# Patient Record
Sex: Female | Born: 2000 | Hispanic: Yes | Marital: Single | Smoking: Former smoker
Health system: Southern US, Community
[De-identification: ages and names within clinical notes are randomized; demographics above are authoritative.]

## PROBLEM LIST (undated history)

## (undated) ENCOUNTER — Inpatient Hospital Stay (HOSPITAL_COMMUNITY): Payer: Self-pay

## (undated) DIAGNOSIS — O139 Gestational [pregnancy-induced] hypertension without significant proteinuria, unspecified trimester: Secondary | ICD-10-CM

## (undated) DIAGNOSIS — O26613 Liver and biliary tract disorders in pregnancy, third trimester: Secondary | ICD-10-CM

## (undated) DIAGNOSIS — K831 Obstruction of bile duct: Secondary | ICD-10-CM

## (undated) DIAGNOSIS — O26643 Intrahepatic cholestasis of pregnancy, third trimester: Secondary | ICD-10-CM

## (undated) HISTORY — DX: Intrahepatic cholestasis of pregnancy, third trimester: O26.643

## (undated) HISTORY — DX: Obstruction of bile duct: K83.1

## (undated) HISTORY — DX: Liver and biliary tract disorders in pregnancy, third trimester: O26.613

---
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
## 2018-01-09 ENCOUNTER — Encounter (HOSPITAL_COMMUNITY): Payer: Self-pay | Admitting: *Deleted

## 2018-01-09 ENCOUNTER — Inpatient Hospital Stay (HOSPITAL_COMMUNITY): Payer: Self-pay

## 2018-01-09 ENCOUNTER — Inpatient Hospital Stay (HOSPITAL_COMMUNITY)
Admission: AD | Admit: 2018-01-09 | Discharge: 2018-01-09 | Disposition: A | Discharge status: Home / Self Care | Payer: Self-pay | Source: Ambulatory Visit | Attending: Obstetrics and Gynecology | Admitting: Obstetrics and Gynecology

## 2018-01-09 ENCOUNTER — Ambulatory Visit (INDEPENDENT_AMBULATORY_CARE_PROVIDER_SITE_OTHER): Payer: Self-pay

## 2018-01-09 DIAGNOSIS — Z789 Other specified health status: Secondary | ICD-10-CM

## 2018-01-09 DIAGNOSIS — O23591 Infection of other part of genital tract in pregnancy, first trimester: Secondary | ICD-10-CM | POA: Insufficient documentation

## 2018-01-09 DIAGNOSIS — O23599 Infection of other part of genital tract in pregnancy, unspecified trimester: Secondary | ICD-10-CM | POA: Diagnosis present

## 2018-01-09 DIAGNOSIS — Z3A01 Less than 8 weeks gestation of pregnancy: Secondary | ICD-10-CM | POA: Insufficient documentation

## 2018-01-09 DIAGNOSIS — O209 Hemorrhage in early pregnancy, unspecified: Secondary | ICD-10-CM | POA: Insufficient documentation

## 2018-01-09 DIAGNOSIS — Z3480 Encounter for supervision of other normal pregnancy, unspecified trimester: Secondary | ICD-10-CM

## 2018-01-09 DIAGNOSIS — B9689 Other specified bacterial agents as the cause of diseases classified elsewhere: Secondary | ICD-10-CM | POA: Diagnosis present

## 2018-01-09 DIAGNOSIS — Z3201 Encounter for pregnancy test, result positive: Secondary | ICD-10-CM

## 2018-01-09 DIAGNOSIS — O09899 Supervision of other high risk pregnancies, unspecified trimester: Secondary | ICD-10-CM

## 2018-01-09 LAB — WET PREP, GENITAL
Sperm: NONE SEEN
Trich, Wet Prep: NONE SEEN
Yeast Wet Prep HPF POC: NONE SEEN

## 2018-01-09 LAB — CBC WITH DIFFERENTIAL/PLATELET
BASOS ABS: 0 10*3/uL (ref 0.0–0.1)
Basophils Relative: 0 %
EOS ABS: 0.1 10*3/uL (ref 0.0–0.5)
EOS PCT: 1 %
HCT: 42.9 % (ref 36.0–46.0)
Hemoglobin: 13.9 g/dL (ref 12.0–15.0)
LYMPHS ABS: 2.7 10*3/uL (ref 0.7–4.0)
LYMPHS PCT: 22 %
MCH: 27.8 pg (ref 26.0–34.0)
MCHC: 32.4 g/dL (ref 30.0–36.0)
MCV: 85.8 fL (ref 80.0–100.0)
MONO ABS: 0.6 10*3/uL (ref 0.1–1.0)
MONOS PCT: 5 %
Neutro Abs: 9 10*3/uL — ABNORMAL HIGH (ref 1.7–7.7)
Neutrophils Relative %: 72 %
PLATELETS: 269 10*3/uL (ref 150–400)
RBC: 5 MIL/uL (ref 3.87–5.11)
RDW: 15.5 % (ref 11.5–15.5)
WBC: 12.5 10*3/uL — ABNORMAL HIGH (ref 4.0–10.5)
nRBC: 0 % (ref 0.0–0.2)

## 2018-01-09 LAB — URINALYSIS, ROUTINE W REFLEX MICROSCOPIC
BILIRUBIN URINE: NEGATIVE
Glucose, UA: NEGATIVE mg/dL
Hgb urine dipstick: NEGATIVE
Ketones, ur: NEGATIVE mg/dL
LEUKOCYTES UA: NEGATIVE
NITRITE: NEGATIVE
PH: 5 (ref 5.0–8.0)
Protein, ur: NEGATIVE mg/dL
SPECIFIC GRAVITY, URINE: 1.023 (ref 1.005–1.030)

## 2018-01-09 LAB — POCT PREGNANCY, URINE: Preg Test, Ur: POSITIVE — AB

## 2018-01-09 LAB — HCG, QUANTITATIVE, PREGNANCY: HCG, BETA CHAIN, QUANT, S: 9684 m[IU]/mL — AB (ref <5)

## 2018-01-09 MED ORDER — PROMETHAZINE HCL 25 MG PO TABS: 12.5000 mg | ORAL_TABLET | Freq: Four times a day (QID) | ORAL | 0 refills | Status: DC | PRN

## 2018-01-09 MED ORDER — METRONIDAZOLE 500 MG PO TABS: 500.0000 mg | ORAL_TABLET | Freq: Two times a day (BID) | ORAL | 0 refills | Status: DC

## 2018-01-09 NOTE — MAU Provider Note (Signed)
History     CSN: [id number]  Arrival date and time: 01/09/18 4098   First Provider Initiated Contact with Patient 01/09/18 0940      Chief Complaint  Patient presents with  . Vaginal Bleeding   Kellie Cook is a 17 y.o. G2P1001 at [redacted]w[redacted]d who is here today with vaginal bleeding.   Vaginal Bleeding  The patient's primary symptoms include pelvic pain and vaginal bleeding. The patient's pertinent negatives include no vaginal discharge. This is a new problem. The current episode started yesterday. The problem occurs constantly. The problem has been unchanged. The pain is mild. The problem affects both sides. She is pregnant. Pertinent negatives include no chills, dysuria, fever, frequency, nausea or vomiting. The vaginal discharge was bloody. The vaginal bleeding is spotting. She has not been passing clots. She has not been passing tissue. Nothing aggravates the symptoms. She has tried nothing for the symptoms. Her menstrual history has been regular (LMP 12/16/17 ).    OB History    Gravida  2   Para  1   Term  1   Preterm      AB      Living  1     SAB      TAB      Ectopic      Multiple  0   Live Births  1           Past Medical History:  Diagnosis Date  . Cholestasis during pregnancy in third trimester     Past Surgical History:  Procedure Laterality Date  . SUBDERMAL ETONOGESTREL IMPLANT INSERTION  03/24/2016        Family History  Problem Relation Age of Onset  . Heart disease Mother   . Heart disease Father     Social History   Tobacco Use  . Smoking status: Never Smoker  . Smokeless tobacco: Never Used  Substance Use Topics  . Alcohol use: No  . Drug use: No    Allergies: No Known Allergies  Medications Prior to Admission  Medication Sig Dispense Refill Last Dose  . ibuprofen (ADVIL,MOTRIN) 600 MG tablet Take 1 tablet (600 mg total) by mouth every 6 (six) hours. (Patient not taking: Reported on 05/12/2016) 30 tablet 0 Not Taking   . norgestimate-ethinyl estradiol (ORTHO-CYCLEN,SPRINTEC,PREVIFEM) 0.25-35 MG-MCG tablet Take 1 tablet by mouth daily. (Patient not taking: Reported on 01/09/2018) 1 Package 0 Not Taking  . Prenatal Vit-Fe Fumarate-FA (PRENATAL MULTIVITAMIN) TABS tablet Take 1 tablet by mouth daily.    Not Taking    Review of Systems  Constitutional: Negative for chills and fever.  Gastrointestinal: Negative for nausea and vomiting.  Genitourinary: Positive for pelvic pain and vaginal bleeding. Negative for dysuria, frequency and vaginal discharge.   Physical Exam   Blood pressure 126/60, pulse 88, temperature 97.9 F (36.6 C), temperature source Oral, resp. rate 16, last menstrual period 12/16/2017, SpO2 99 %, unknown if currently breastfeeding.  Physical Exam  Nursing note and vitals reviewed. Constitutional: She is oriented to person, place, and time. She appears well-developed and well-nourished. No distress.  HENT:  Head: Normocephalic.  Cardiovascular: Normal rate.  Respiratory: Effort normal.  GI: Soft. There is no tenderness. There is no rebound.  Genitourinary:  Genitourinary Comments:  External: no lesion Vagina: scant amount of blood seen  Cervix: pink, smooth, closed Uterus: NSSC Adnexa: NT   Neurological: She is alert and oriented to person, place, and time.  Skin: Skin is warm and dry.  Psychiatric: She has  a normal mood and affect.    Results for orders placed or performed during the hospital encounter of 01/09/18 (from the past 24 hour(s))  CBC with Differential/Platelet     Status: Abnormal   Collection Time: 01/09/18  9:47 AM  Result Value Ref Range   WBC 12.5 (H) 4.0 - 10.5 K/uL   RBC 5.00 3.87 - 5.11 MIL/uL   Hemoglobin 13.9 12.0 - 15.0 g/dL   HCT 45.4 09.8 - 11.9 %   MCV 85.8 80.0 - 100.0 fL   MCH 27.8 26.0 - 34.0 pg   MCHC 32.4 30.0 - 36.0 g/dL   RDW 14.7 82.9 - 56.2 %   Platelets 269 150 - 400 K/uL   nRBC 0.0 0.0 - 0.2 %   Neutrophils Relative % 72 %   Neutro  Abs 9.0 (H) 1.7 - 7.7 K/uL   Lymphocytes Relative 22 %   Lymphs Abs 2.7 0.7 - 4.0 K/uL   Monocytes Relative 5 %   Monocytes Absolute 0.6 0.1 - 1.0 K/uL   Eosinophils Relative 1 %   Eosinophils Absolute 0.1 0.0 - 0.5 K/uL   Basophils Relative 0 %   Basophils Absolute 0.0 0.0 - 0.1 K/uL  hCG, quantitative, pregnancy     Status: Abnormal   Collection Time: 01/09/18  9:47 AM  Result Value Ref Range   hCG, Beta Chain, Quant, S 9,684 (H) <5 mIU/mL  Wet prep, genital     Status: Abnormal   Collection Time: 01/09/18 10:24 AM  Result Value Ref Range   Yeast Wet Prep HPF POC NONE SEEN NONE SEEN   Trich, Wet Prep NONE SEEN NONE SEEN   Clue Cells Wet Prep HPF POC PRESENT (A) NONE SEEN   WBC, Wet Prep HPF POC FEW (A) NONE SEEN   Sperm NONE SEEN   Urinalysis, Routine w reflex microscopic     Status: None   Collection Time: 01/09/18 10:25 AM  Result Value Ref Range   Color, Urine YELLOW YELLOW   APPearance CLEAR CLEAR   Specific Gravity, Urine 1.023 1.005 - 1.030   pH 5.0 5.0 - 8.0   Glucose, UA NEGATIVE NEGATIVE mg/dL   Hgb urine dipstick NEGATIVE NEGATIVE   Bilirubin Urine NEGATIVE NEGATIVE   Ketones, ur NEGATIVE NEGATIVE mg/dL   Protein, ur NEGATIVE NEGATIVE mg/dL   Nitrite NEGATIVE NEGATIVE   Leukocytes, UA NEGATIVE NEGATIVE   US Ob Less Than 14 Weeks With Ob Transvaginal  Result Date: 01/09/2018 CLINICAL DATA:  Vaginal bleeding in pregnancy. EXAM: OBSTETRIC <14 WK [place] AND TRANSVAGINAL OB US TECHNIQUE: Both transabdominal and transvaginal ultrasound examinations were performed for complete evaluation of the gestation as well as the maternal uterus, adnexal regions, and pelvic cul-de-sac. Transvaginal technique was performed to assess early pregnancy. COMPARISON:  None. FINDINGS: Intrauterine gestational sac: Single Yolk sac:  Visualized. Embryo:  Not Visualized. MSD: 9.71 mm   5 w   5 d Maternal uterus/adnexae: Subchorionic hemorrhage: None Right ovary: Normal Left ovary: Normal Other :None  Free fluid:  None IMPRESSION: 1. Probable early intrauterine gestational sac with a yolk sac, but no fetal pole, or cardiac activity yet visualized. Recommend follow-up quantitative B-HCG levels and follow-up US in 14 days to confirm and assess viability. This recommendation follows SRU consensus guidelines: Diagnostic Criteria for Nonviable Pregnancy Early in the First Trimester. Malva Limes Med 2013; 130:8657-84. Electronically Signed   By: Signa Kell M.D.   On: 01/09/2018 11:27   MAU Course  Procedures  MDM  Assessment and Plan   1. Bacterial vaginosis in pregnancy   2. Vaginal bleeding in pregnancy, first trimester   3. Bleeding in early pregnancy   4. Language barrier   5. Intrauterine pregnancy in teenager    DC home Comfort measures reviewed  1st Trimester precautions  Bleeding precautions RX: flagyl 500mg  BID x 7 days  Return to MAU as needed FU with OB as planned  Follow-up Information    [facility]   Specialty:  Obstetrics and Gynecology Contact information: [address] [place] [id number] [phone]           Thressa ShellerHeather Tmya Wigington 01/09/2018, 11:42 AM

## 2018-01-09 NOTE — MAU Note (Addendum)
Pt is having bleeding since last night + pregnancy test. Kellie FlesherWent to clinic this morning, but was sent to MAU for evaluation.

## 2018-01-09 NOTE — MAU Provider Note (Signed)
  History     CSN: [id number]  Arrival date and time: 01/09/18 [id number]   First Provider Initiated Contact with Patient 01/09/18 0940      Chief Complaint  Patient presents with  . Vaginal Bleeding   Light pink spotting/bleeding that began last night and has continued, with cramps in lower abdomen and back. Reports increased urinary frequency, burning, and dysuria, headaches since she took out her Nexplanon a month ago, and nausea x 1wk.    Past Medical History:  Diagnosis Date  . Cholestasis during pregnancy in third trimester     Past Surgical History:  Procedure Laterality Date  . SUBDERMAL ETONOGESTREL IMPLANT INSERTION  03/24/2016        Family History  Problem Relation Age of Onset  . Heart disease Mother   . Heart disease Father     Social History   Tobacco Use  . Smoking status: Never Smoker  . Smokeless tobacco: Never Used  Substance Use Topics  . Alcohol use: No  . Drug use: No    Allergies: No Known Allergies  Medications Prior to Admission  Medication Sig Dispense Refill Last Dose  . ibuprofen (ADVIL,MOTRIN) 600 MG tablet Take 1 tablet (600 mg total) by mouth every 6 (six) hours. (Patient not taking: Reported on 05/12/2016) 30 tablet 0 Not Taking  . norgestimate-ethinyl estradiol (ORTHO-CYCLEN,SPRINTEC,PREVIFEM) 0.25-35 MG-MCG tablet Take 1 tablet by mouth daily. (Patient not taking: Reported on 01/09/2018) 1 Package 0 Not Taking  . Prenatal Vit-Fe Fumarate-FA (PRENATAL MULTIVITAMIN) TABS tablet Take 1 tablet by mouth daily.    Not Taking    Review of Systems  Constitutional: Negative.   HENT: Negative.   Eyes: Negative.   Respiratory: Negative.   Cardiovascular: Negative.   Gastrointestinal: Positive for nausea (x1wk). Negative for constipation and diarrhea.  Endocrine: Negative.   Genitourinary: Positive for difficulty urinating, dysuria, frequency, pelvic pain and vaginal bleeding.  Musculoskeletal: Positive for back pain (lower back).  Skin:  Negative.   Allergic/Immunologic: Negative.   Neurological: Positive for headaches (x589mo).  Hematological: Negative.   Psychiatric/Behavioral: Negative.    Physical Exam   Last menstrual period 12/16/2017, unknown if currently breastfeeding.  Physical Exam  Constitutional: She is oriented to person, place, and time. She appears well-developed and well-nourished.  Neck: Normal range of motion.  Cardiovascular: Normal rate and regular rhythm.  Respiratory: Effort normal.  GI: Soft.  Genitourinary: Uterus normal. Pelvic exam was performed with patient supine. Cervix exhibits no motion tenderness, no discharge and no friability. Right adnexum displays no tenderness. Left adnexum displays no tenderness. There is bleeding (Scant bleeding visualized) in the vagina.    Genitourinary Comments: Cervix soft and a fingertip, but not completely open.  Musculoskeletal: Normal range of motion.  Neurological: She is alert and oriented to person, place, and time.  Skin: Skin is warm and dry.  Psychiatric: She has a normal mood and affect. Her behavior is normal. Judgment and thought content normal.    MAU Course  Procedures  MDM CBC - normal TVUS - yolk and gestational sac visualized UA - normal Wet prep - clue cells seen  Assessment and Plan  Bacterial vaginosis in pregnancy  Vaginal bleeding in pregnancy, first trimester - Plan: US OB LESS THAN 14 WEEKS WITH OB TRANSVAGINAL, US OB LESS THAN 14 WEEKS WITH OB TRANSVAGINAL  Bleeding in early pregnancy  Language barrier  Intrauterine pregnancy in teenager    Bernerd LimboJamilla R Delvin Hedeen 01/09/2018, 9:49 AM

## 2018-01-09 NOTE — Discharge Instructions (Signed)
Vaginosis bacteriana  Bacterial Vaginosis  La vaginosis bacteriana es una infeccin vaginal que perturba el equilibrio normal de las bacterias que se encuentran en la vagina. Es el resultado de un crecimiento excesivo de ciertas bacterias. Es la infeccin vaginal ms frecuente en mujeres de [id number].  Debido a que la vaginosis bacteriana aumenta el riesgo de contraerETS(enfermedades de transmisin sexual), el tratamiento puede ayudar a reducir el riesgo de contraer clamidia, gonorrea, herpes y VIH(virus de inmunodeficiencia humana). Adems, el tratamiento es importante para prevenir complicaciones en embarazadas, ya que esta afeccin puede provocar un parto antes de tiempo(prematuro).  Cules son las causas?  Esta afeccin se origina por un aumento de bacterias nocivas que, generalmente, estn presentes en cantidades pequeas en la vagina. Sin embargo, la causa de su desarrollo no se comprende totalmente.  Qu incrementa el riesgo?  Los siguientes factores pueden hacer que usted sea ms propenso a tener esta enfermedad:   Tener una nueva pareja sexual o mltiples parejas sexuales.   Mantener relaciones sexuales sin proteccin.   Las duchas vaginales.   Tener colocado un dispositivo intrauterino(DIU).   Fumar.   Consumo excesivo de drogas y alcohol.   Tomar ciertos antibiticos.   Estar embarazada.    El contagio no se produce en baos, por ropas de cama, en piscinas ni por contacto con objetos.  Cules son los signos o los sntomas?  Los sntomas de esta afeccin incluyen los siguientes:   Secrecin vaginal de color gris o blanco. La secrecin tambin puede ser acuosa o espumosa.   Secrecin vaginal con olor similar al pescado; en especial, despus de mantener relaciones sexuales o durante los perodos menstruales.   Picazn en la vagina y alrededor de esta.   Ardor o dolor al orinar.    Algunas mujeres que padecen vaginosis bacteriana no presentan signos ni sntomas.  Cmo se  diagnostica?  Esta afeccin se diagnostica en funcin de lo siguiente:   Sus antecedentes mdicos.   Examen fsico de la vagina.   Anlisis de una muestra de fluido vaginal con un microscopio para determinar si hay una gran cantidad de bacterias nocivas o de clulas anormales. El mdico podra utilizar un hisopo de algodn o una pequea esptula de madera para recolectar la muestra.    Cmo se trata?  Esta afeccin se trata con antibiticos. Podran administrarse en forma de pastillas, de una crema vaginal o de un medicamento que se coloca dentro de la vagina(vulo vaginal). Si la afeccin se repite despus del tratamiento, podra indicarse una segunda tanda de antibiticos.  Siga estas indicaciones en su casa:  Medicamentos   Tome los medicamentos de venta libre y los recetados solamente como se lo haya indicado el mdico.   Tome o utilice los antibiticos como se lo haya indicado el mdico. No deje de tomar ni de usar los antibiticos aunque comience a sentirse mejor.  Instrucciones generales   Si tiene una pareja sexual mujer, avsele que sufre una infeccin vaginal. Ella debera consultar con su mdico y recibir tratamiento si tuviera sntomas. Si tiene una pareja sexual hombre, l no necesita tratamiento.   Durante el tratamiento:  ? Evite la actividad sexual hasta que haya finalizado el tratamiento.  ? No se haga duchas vaginales.  ? Evite consumir alcohol como se lo haya indicado el mdico.  ? Evite amamantar como se lo haya indicado el mdico.   Beba gran cantidad de lquido para mantener la orina de tono claro o color amarillo plido.   Mantenga limpia   la zona que rodea la vagina y el recto.  ? Lave la zona diariamente con agua tibia.  ? Cuando vaya al bao, siempre higiencese desde adelante hacia atrs.   Concurra a todas las visitas de control como se lo haya indicado el mdico. Esto es importante.  Cmo se evita?   No se haga duchas vaginales.   Higiencese la parte exterior de la vagina  solo con agua tibia.   Utilice proteccin cuando mantenga relaciones sexuales. Por ejemplo, preservativos de ltex y protectores bucales.   Limite la cantidad de parejas sexuales. Para prevenir la vaginosis bacteriana, es mejor mantener relaciones sexuales solo con una pareja(monogamia).   Es importante que usted y su pareja sexual se realicen estudios de deteccin de ETS.   Use ropa interior de algodn o con revestimiento de algodn.   No use pantalones ni pantis ajustados; en especial, durante el verano.   Limite la cantidad de alcohol que bebe.   No consuma ningn producto que contenga nicotina o tabaco, como cigarrillos y cigarrillos electrnicos. Si necesita ayuda para dejar de fumar, consulte al mdico.   No consuma drogas.  Dnde encontrar ms informacin:   Centros para el control y la prevencin de enfermedades(Centers for Disease Control and Prevention):www.cdc.gov/std   Asociacin Estadounidense de la Salud Sexual (American Sexual Health Association, ASHA): www.ashastd.org   Departamento de Salud y Servicios Humanos de los Estados Unidos, Oficina de Salud de la Mujer(U.S. Department of Health and Human Services, Office on Women's Health): www.womenshealth.gov/ o https://www.womenshealth.gov/a-z-topics/bacterial-vaginosis  Comunquese con un mdico si:   Los sntomas no mejoran, ni siquiera despus del tratamiento.   Tiene ms secrecin o siente dolor al orinar.   Tiene fiebre.   Siente dolor en el abdomen.   Siente dolor durante las relaciones sexuales.   Tiene sangrado vaginal entre los periodos menstruales.  Resumen   La vaginosis bacteriana es una infeccin vaginal que perturba el equilibrio normal de las bacterias que se encuentran en la vagina.   Debido a que la vaginosis bacteriana aumenta el riesgo de contraerETS(enfermedades de transmisin sexual), el tratamiento puede ayudar a reducir el riesgo de contraer clamidia, gonorrea, herpes y VIH(virus de inmunodeficiencia  humana). Adems, el tratamiento es importante para prevenir complicaciones en embarazadas, ya que esta afeccin puede provocar un parto antes de tiempo(prematuro).   Esta afeccin se trata con antibiticos. Podran administrarse en forma de pastillas, de una crema vaginal o de un medicamento que se coloca dentro de la vagina(vulo vaginal).  Esta informacin no tiene como fin reemplazar el consejo del mdico. Asegrese de hacerle al mdico cualquier pregunta que tenga.  Document Released: 05/03/2007 Document Revised: 06/01/2016 Document Reviewed: 09/05/2012  Elsevier Interactive Patient Education  2018 Elsevier Inc.

## 2018-01-09 NOTE — Progress Notes (Signed)
Advised pt that she was pregnant (+ UPT) , pt hoped that she was not, pt has been bleeding X2 days. Pt states its like a light period. LMP 12/16/17. Called MAU to Advise of her coming up to be evaluated.

## 2018-01-10 LAB — GC/CHLAMYDIA PROBE AMP (~~LOC~~) NOT AT ARMC
Chlamydia: NEGATIVE
NEISSERIA GONORRHEA: NEGATIVE

## 2018-01-11 ENCOUNTER — Other Ambulatory Visit: Payer: Self-pay

## 2018-01-11 ENCOUNTER — Encounter (HOSPITAL_COMMUNITY): Payer: Self-pay

## 2018-01-11 ENCOUNTER — Inpatient Hospital Stay (HOSPITAL_COMMUNITY)
Admission: AD | Admit: 2018-01-11 | Discharge: 2018-01-11 | Disposition: A | Discharge status: Home / Self Care | Payer: Self-pay | Source: Ambulatory Visit | Attending: Obstetrics and Gynecology | Admitting: Obstetrics and Gynecology

## 2018-01-11 DIAGNOSIS — O26891 Other specified pregnancy related conditions, first trimester: Secondary | ICD-10-CM

## 2018-01-11 DIAGNOSIS — O99341 Other mental disorders complicating pregnancy, first trimester: Secondary | ICD-10-CM | POA: Insufficient documentation

## 2018-01-11 DIAGNOSIS — F419 Anxiety disorder, unspecified: Secondary | ICD-10-CM | POA: Insufficient documentation

## 2018-01-11 DIAGNOSIS — Z3A01 Less than 8 weeks gestation of pregnancy: Secondary | ICD-10-CM | POA: Insufficient documentation

## 2018-01-11 LAB — URINALYSIS, ROUTINE W REFLEX MICROSCOPIC
Bilirubin Urine: NEGATIVE
GLUCOSE, UA: NEGATIVE mg/dL
HGB URINE DIPSTICK: NEGATIVE
Ketones, ur: NEGATIVE mg/dL
NITRITE: NEGATIVE
PH: 6 (ref 5.0–8.0)
Protein, ur: NEGATIVE mg/dL
SPECIFIC GRAVITY, URINE: 1.013 (ref 1.005–1.030)

## 2018-01-11 NOTE — MAU Note (Signed)
Presents with c/o VB, reports was previously seen for VB but it continues, reports VB everyday.  Reports lower abdominal pain.  Also states chest feels heavy, states as if she's anxious. Pt also reports having dizziness.

## 2018-01-11 NOTE — MAU Provider Note (Addendum)
Patient Kellie Cook is a 17 y.o. G2P1001 At 641w5d here with complaints of feeling dizzy, tired. She feels overall unwell; she also feels nausea. Her vaginal bleeding has subsided; she does not want any testing or cultures today.   History     CSN: [id number]  Arrival date and time: 01/11/18 1434   First Provider Initiated Contact with Patient 01/11/18 1555      Chief Complaint  Patient presents with  . Vaginal Bleeding   Vaginal Bleeding  Associated symptoms include abdominal pain and back pain.  Her vaginal  Bleeding is minimal, just some brown spotting. Occasional pain in the abdomen but it is getting better.   OB History    Gravida  2   Para  1   Term  1   Preterm      AB      Living  1     SAB      TAB      Ectopic      Multiple  0   Live Births  1           Past Medical History:  Diagnosis Date  . Cholestasis during pregnancy in third trimester     Past Surgical History:  Procedure Laterality Date  . SUBDERMAL ETONOGESTREL IMPLANT INSERTION  03/24/2016        Family History  Problem Relation Age of Onset  . Heart disease Mother   . Heart disease Father     Social History   Tobacco Use  . Smoking status: Never Smoker  . Smokeless tobacco: Never Used  Substance Use Topics  . Alcohol use: No  . Drug use: No    Allergies: No Known Allergies  Medications Prior to Admission  Medication Sig Dispense Refill Last Dose  . ibuprofen (ADVIL,MOTRIN) 600 MG tablet Take 1 tablet (600 mg total) by mouth every 6 (six) hours. (Patient not taking: Reported on 05/12/2016) 30 tablet 0 Not Taking  . metroNIDAZOLE (FLAGYL) 500 MG tablet Take 1 tablet (500 mg total) by mouth 2 (two) times daily. 14 tablet 0   . norgestimate-ethinyl estradiol (ORTHO-CYCLEN,SPRINTEC,PREVIFEM) 0.25-35 MG-MCG tablet Take 1 tablet by mouth daily. (Patient not taking: Reported on 01/09/2018) 1 Package 0 Not Taking  . Prenatal Vit-Fe Fumarate-FA (PRENATAL MULTIVITAMIN)  TABS tablet Take 1 tablet by mouth daily.    Not Taking  . promethazine (PHENERGAN) 25 MG tablet Take 0.5-1 tablets (12.5-25 mg total) by mouth every 6 (six) hours as needed. 30 tablet 0     Review of Systems  Constitutional: Negative.   HENT: Negative.   Respiratory: Negative.   Gastrointestinal: Positive for abdominal pain.  Genitourinary: Positive for vaginal bleeding.  Musculoskeletal: Positive for back pain.  Neurological: Positive for dizziness.   Physical Exam   Blood pressure 120/81, pulse 94, temperature 98.2 F (36.8 C), temperature source Oral, resp. rate 18, weight 92 kg, last menstrual period 12/16/2017, SpO2 99 %, unknown if currently breastfeeding.  Physical Exam  Constitutional: She is oriented to person, place, and time. She appears well-developed.  HENT:  Head: Normocephalic.  Neck: Normal range of motion.  Respiratory: Effort normal.  GI: Soft.  Musculoskeletal: Normal range of motion.  Neurological: She is alert and oriented to person, place, and time.  Skin: Skin is warm and dry.  Psychiatric: She has a normal mood and affect.    MAU Course  Procedures  MDM -patient very tearful about pregnancy; she does not want any other work-up.  -She is  concerned about cost.  -Reviewed patients [place] and beta; plan as of 12/3 was for patient to have repeat viability scan in 14 days. Order has been placed but not scheduled yet.   Assessment and Plan   1. Anxiety    2. Patient is unsure about her pregnancy status; [facility].   3. Patient stable for discharge with warning precautions.   4. All questions answered.   Charlesetta Garibaldi  01/11/2018, 4:12 PM

## 2018-01-11 NOTE — Discharge Instructions (Signed)
[facility] [place] [address], [place], [place] [address]  Hours:  Open ? Closes 7PM   Phone: [phone]

## 2018-03-26 DIAGNOSIS — Z803 Family history of malignant neoplasm of breast: Secondary | ICD-10-CM | POA: Insufficient documentation

## 2018-12-15 ENCOUNTER — Encounter (HOSPITAL_COMMUNITY): Payer: Self-pay

## 2019-04-13 ENCOUNTER — Other Ambulatory Visit: Payer: Self-pay

## 2019-04-13 ENCOUNTER — Emergency Department (HOSPITAL_COMMUNITY): Payer: Self-pay

## 2019-04-13 ENCOUNTER — Encounter (HOSPITAL_COMMUNITY): Payer: Self-pay | Admitting: *Deleted

## 2019-04-13 ENCOUNTER — Emergency Department (HOSPITAL_COMMUNITY)
Admission: EM | Admit: 2019-04-13 | Discharge: 2019-04-13 | Disposition: A | Discharge status: Home / Self Care | Payer: Self-pay | Attending: Emergency Medicine | Admitting: Emergency Medicine

## 2019-04-13 DIAGNOSIS — R079 Chest pain, unspecified: Secondary | ICD-10-CM

## 2019-04-13 DIAGNOSIS — R21 Rash and other nonspecific skin eruption: Secondary | ICD-10-CM

## 2019-04-13 DIAGNOSIS — Z79899 Other long term (current) drug therapy: Secondary | ICD-10-CM | POA: Insufficient documentation

## 2019-04-13 DIAGNOSIS — R0789 Other chest pain: Secondary | ICD-10-CM | POA: Insufficient documentation

## 2019-04-13 DIAGNOSIS — L519 Erythema multiforme, unspecified: Secondary | ICD-10-CM | POA: Insufficient documentation

## 2019-04-13 DIAGNOSIS — R0600 Dyspnea, unspecified: Secondary | ICD-10-CM | POA: Insufficient documentation

## 2019-04-13 DIAGNOSIS — F17210 Nicotine dependence, cigarettes, uncomplicated: Secondary | ICD-10-CM | POA: Insufficient documentation

## 2019-04-13 LAB — BASIC METABOLIC PANEL
Anion gap: 10 (ref 5–15)
BUN: 9 mg/dL (ref 6–20)
CO2: 19 mmol/L — ABNORMAL LOW (ref 22–32)
Calcium: 8.8 mg/dL — ABNORMAL LOW (ref 8.9–10.3)
Chloride: 110 mmol/L (ref 98–111)
Creatinine, Ser: 0.5 mg/dL (ref 0.44–1.00)
GFR calc Af Amer: >60 mL/min (ref >60)
GFR calc non Af Amer: >60 mL/min (ref >60)
Glucose, Bld: 126 mg/dL — ABNORMAL HIGH (ref 70–99)
Potassium: 3.9 mmol/L (ref 3.5–5.1)
Sodium: 139 mmol/L (ref 135–145)

## 2019-04-13 LAB — CBC
HCT: 47 % — ABNORMAL HIGH (ref 36.0–46.0)
Hemoglobin: 15.1 g/dL — ABNORMAL HIGH (ref 12.0–15.0)
MCH: 28 pg (ref 26.0–34.0)
MCHC: 32.1 g/dL (ref 30.0–36.0)
MCV: 87.2 fL (ref 80.0–100.0)
Platelets: 260 10*3/uL (ref 150–400)
RBC: 5.39 MIL/uL — ABNORMAL HIGH (ref 3.87–5.11)
RDW: 16.4 % — ABNORMAL HIGH (ref 11.5–15.5)
WBC: 12.1 10*3/uL — ABNORMAL HIGH (ref 4.0–10.5)
nRBC: 0 % (ref 0.0–0.2)

## 2019-04-13 LAB — POC URINE PREG, ED: Preg Test, Ur: NEGATIVE

## 2019-04-13 LAB — I-STAT BETA HCG BLOOD, ED (MC, WL, AP ONLY): I-stat hCG, quantitative: <5 m[IU]/mL (ref <5)

## 2019-04-13 LAB — HIV ANTIBODY (ROUTINE TESTING W REFLEX): HIV Screen 4th Generation wRfx: NONREACTIVE

## 2019-04-13 LAB — D-DIMER, QUANTITATIVE: D-Dimer, Quant: 0.31 ug/mL-FEU (ref 0.00–0.50)

## 2019-04-13 LAB — TROPONIN I (HIGH SENSITIVITY): Troponin I (High Sensitivity): 4 ng/L (ref <18)

## 2019-04-13 MED ORDER — SODIUM CHLORIDE 0.9% FLUSH: 3.0000 mL | Freq: Once | INTRAVENOUS | Status: DC

## 2019-04-13 MED ORDER — SODIUM CHLORIDE 0.9 % IV BOLUS
1000.0000 mL | Freq: Once | INTRAVENOUS | Status: AC
  Administered 2019-04-13: 1000 mL via INTRAVENOUS

## 2019-04-13 MED ORDER — KETOROLAC TROMETHAMINE 15 MG/ML IJ SOLN
15.0000 mg | Freq: Once | INTRAMUSCULAR | Status: AC
  Administered 2019-04-13: 15 mg via INTRAVENOUS
  Filled 2019-04-13: qty 1

## 2019-04-13 MED ORDER — DEXAMETHASONE SODIUM PHOSPHATE 10 MG/ML IJ SOLN
10.0000 mg | Freq: Once | INTRAMUSCULAR | Status: AC
  Administered 2019-04-13: 10 mg via INTRAVENOUS
  Filled 2019-04-13: qty 1

## 2019-04-13 MED ORDER — DIPHENHYDRAMINE HCL 25 MG PO CAPS
50.0000 mg | ORAL_CAPSULE | Freq: Once | ORAL | Status: AC
  Administered 2019-04-13: 17:00:00 50 mg via ORAL
  Filled 2019-04-13: qty 2

## 2019-04-13 NOTE — ED Provider Notes (Signed)
[facility] Provider Note   CSN: [id number] Arrival date & time: 04/13/19  1523     History Chief Complaint  Patient presents with  . Chest Pain  . Allergic Reaction    Kellie Cook is a 19 y.o. female.  Patient with history of cholestasis during pregnancy, Spanish-speaking only presents with rash, chest pain and shortness of breath since yesterday evening.  No history of similar.  No significant allergies known.  Patient put a cream on her feet however no other new exposures.  Patient noticed a red rash mostly to her thighs.  Mild pleuritic chest pain history.  No known blood clot history or history of taking blood thinners.  No known cardiac history of a patient in the past has had chest pain and has passed out in the past.  No high blood pressure diabetes.  No recent surgeries.        Past Medical History:  Diagnosis Date  . Cholestasis during pregnancy in third trimester     Patient Active Problem List   Diagnosis Date Noted  . Bleeding in early pregnancy 01/09/2018  . Bacterial vaginosis in pregnancy 01/09/2018  . Hallucination 05/12/2016  . Postpartum care and examination 05/12/2016  . Language barrier 02/23/2016  . Cholestasis during pregnancy in third trimester 02/15/2016  . Obesity in pregnancy 10/30/2015  . Supervision of other high risk pregnancy, antepartum 10/20/2015  . Intrauterine pregnancy in teenager 10/20/2015    Past Surgical History:  Procedure Laterality Date  . SUBDERMAL ETONOGESTREL IMPLANT INSERTION  03/24/2016         OB History    Gravida  2   Para  1   Term  1   Preterm      AB      Living  1     SAB      TAB      Ectopic      Multiple  0   Live Births  1           Family History  Problem Relation Age of Onset  . Heart disease Mother   . Heart disease Father     Social History   Tobacco Use  . Smoking status: Current Some Day Smoker  . Smokeless tobacco:  Never Used  Substance Use Topics  . Alcohol use: No  . Drug use: No    Home Medications Prior to Admission medications   Medication Sig Start Date End Date Taking? Authorizing Provider  metroNIDAZOLE (FLAGYL) 500 MG tablet Take 1 tablet (500 mg total) by mouth 2 (two) times daily. 01/09/18   Tresea Mall, CNM  Prenatal Vit-Fe Fumarate-FA (PRENATAL MULTIVITAMIN) TABS tablet Take 1 tablet by mouth daily.     [provider]  promethazine (PHENERGAN) 25 MG tablet Take 0.5-1 tablets (12.5-25 mg total) by mouth every 6 (six) hours as needed. 01/09/18   Tresea Mall, CNM    Allergies    Patient has no known allergies.  Review of Systems   Review of Systems  Constitutional: Negative for chills and fever.  HENT: Negative for congestion.   Eyes: Negative for visual disturbance.  Respiratory: Positive for shortness of breath. Negative for cough.   Cardiovascular: Positive for chest pain. Negative for leg swelling.  Gastrointestinal: Negative for abdominal pain and vomiting.  Genitourinary: Negative for dysuria and flank pain.  Musculoskeletal: Negative for back pain, neck pain and neck stiffness.  Skin: Positive for rash.  Neurological: Negative for  light-headedness and headaches.    Physical Exam Updated Vital Signs BP 118/76   Pulse 96   Temp 98.1 F (36.7 C)   Resp (!) 21   Ht 4' 11.06" (1.5 m)   Wt 90.7 kg   SpO2 98%   BMI 40.32 kg/m   Physical Exam Vitals and nursing note reviewed.  Constitutional:      Appearance: She is well-developed.  HENT:     Head: Normocephalic and atraumatic.     Comments: No angioedema Eyes:     General:        Right eye: No discharge.        Left eye: No discharge.     Conjunctiva/sclera: Conjunctivae normal.  Neck:     Trachea: No tracheal deviation.  Cardiovascular:     Rate and Rhythm: Regular rhythm. Tachycardia present.     Heart sounds: Normal heart sounds. Heart sounds not distant.  Pulmonary:     Effort:  Pulmonary effort is normal.     Breath sounds: Normal breath sounds.  Abdominal:     General: There is no distension.     Palpations: Abdomen is soft.     Tenderness: There is no abdominal tenderness. There is no guarding.  Musculoskeletal:     Cervical back: Normal range of motion and neck supple.     Right lower leg: No edema.     Left lower leg: No edema.  Skin:    General: Skin is warm.     Findings: Rash (hive like thighs bilateral and hands ) present.     Comments: Patient has circular erythematous rash multiple anterior lateral thighs and dorsal and palmar hands with mild central clearing/pale, target like some of them, mild blanching.  No petechiae or purpura.  Neurological:     Mental Status: She is alert and oriented to person, place, and time.     ED Results / Procedures / Treatments   Labs (all labs ordered are listed, but only abnormal results are displayed) Labs Reviewed  BASIC METABOLIC PANEL - Abnormal; Notable for the following components:      Result Value   CO2 19 (*)    Glucose, Bld 126 (*)    Calcium 8.8 (*)    All other components within normal limits  CBC - Abnormal; Notable for the following components:   WBC 12.1 (*)    RBC 5.39 (*)    Hemoglobin 15.1 (*)    HCT 47.0 (*)    RDW 16.4 (*)    All other components within normal limits  D-DIMER, QUANTITATIVE (NOT AT [facility])  RPR  HIV ANTIBODY (ROUTINE TESTING W REFLEX)  I-STAT BETA HCG BLOOD, ED ([facility], [facility], [facility] ONLY)  POC URINE PREG, ED  TROPONIN I (HIGH SENSITIVITY)    EKG EKG Interpretation  Date/Time:  Saturday April 13 2019 15:32:21 EST Ventricular Rate:  123 PR Interval:  138 QRS Duration: 68 QT Interval:  306 QTC Calculation: 438 R Axis:   64 Text Interpretation: Sinus tachycardia Abnormal ECG Confirmed by Elnora Morrison [phone]) on 04/13/2019 4:18:12 PM   Radiology DG Chest Port 1 View  Result Date: 04/13/2019 CLINICAL DATA:  19 year old with acute onset of chest pain that began yesterday,  worse with inspiration. She also has a rash on her LOWER extremities. EXAM: PORTABLE CHEST 1 VIEW COMPARISON:  None. FINDINGS: Markedly suboptimal inspiration which accentuates the cardiac silhouette and accounts for crowded bronchovascular markings diffusely and mild bibasilar atelectasis. Taking this so account, cardiomediastinal silhouette unremarkable.  Lungs otherwise clear. IMPRESSION: Markedly suboptimal inspiration which accounts for mild bibasilar atelectasis. No acute cardiopulmonary disease otherwise. Electronically Signed   By: Evangeline Dakin M.D.   On: 04/13/2019 16:45    Procedures Procedures (including critical care time)  Medications Ordered in ED Medications  sodium chloride flush (NS) 0.9 % injection 3 mL (has no administration in time range)  diphenhydrAMINE (BENADRYL) capsule 50 mg (50 mg Oral Given 04/13/19 1646)  dexamethasone (DECADRON) injection 10 mg (10 mg Intravenous Given 04/13/19 1645)  sodium chloride 0.9 % bolus 1,000 mL (1,000 mLs Intravenous New Bag/Given 04/13/19 1717)  ketorolac (TORADOL) 15 MG/ML injection 15 mg (15 mg Intravenous Given 04/13/19 1717)    ED Course  I have reviewed the triage vital signs and the nursing notes.  Pertinent labs & imaging results that were available during my care of the patient were reviewed by me and considered in my medical decision making (see chart for details).    MDM Rules/Calculators/A&P                      Patient presents for a few symptoms and signs.  Patient does have chest pain and shortness of breath with tachycardia sinus.  EKG revealed sinus tachycardia, rhythm strip reviewed.  Patient low risk for cardiac, troponin negative and she has had symptoms since yesterday evening.  Patient is low risk for blood clots D-dimer sent and normal.  Patient's heart rate improved with supportive treatment and fluids.  Patient has an appetite and oral fluids and food given.  On reassessment patient well-appearing and has no  significant symptoms.  No signs of anaphylaxis or angioedema at this time.  Rash is likely erythema multiforme.  Used translator and discussed follow-up and reasons to return.  Patient comfortable this plan.  IV fluids were given in the ER. Patient's blood work reviewed minimal elevated white blood cell count 12, normal hemoglobin 15.  Troponin negative D-dimer negative.  Final Clinical Impression(s) / ED Diagnoses Final diagnoses:  Dyspnea, unspecified type  Rash and nonspecific skin eruption  Acute chest pain  Erythema multiforme    Rx / DC Orders ED Discharge Orders    None       Elnora Morrison, MD 04/13/19 1920

## 2019-04-13 NOTE — ED Notes (Signed)
Ambulatory to restroom

## 2019-04-13 NOTE — Discharge Instructions (Addendum)
Use Benadryl 50 mg as needed every 6 hours for rash and itching. You were given Decadron which is a steroid that will last 2 days. Follow-up with a local doctor on Monday for reassessment. Return to the emergency room for shortness of breath or new concerns.  Use Benadryl 50 mg segn sea necesario cada 6 horas para el sarpullido y la picazn. Le dieron Decadron, que es un esteroide que durar 2 [address]  [address]. Haga un seguimiento con un mdico local el lunes para una reevaluacin. Regrese a la sala de emergencias si tiene dificultad para respirar o si tiene nuevas preocupaciones.

## 2019-04-13 NOTE — ED Notes (Signed)
Pt d/c home per MD order. Discharge summary reviewed using [place] spanish interpretor, pt verbalizes understanding. Reports discharge ride home. Off unit via WC.

## 2019-04-13 NOTE — ED Notes (Signed)
Pt provided water.  

## 2019-04-13 NOTE — ED Triage Notes (Signed)
Pt states rash to limbs, chest and lung pain that increases with inspiration since yesterday.  Airway patent presently.

## 2019-04-14 LAB — RPR: RPR Ser Ql: NONREACTIVE

## 2022-09-15 ENCOUNTER — Encounter: Payer: Self-pay | Admitting: Family Medicine

## 2022-09-21 ENCOUNTER — Encounter: Payer: Self-pay | Admitting: General Practice

## 2022-09-21 ENCOUNTER — Ambulatory Visit (INDEPENDENT_AMBULATORY_CARE_PROVIDER_SITE_OTHER): Payer: Self-pay | Admitting: General Practice

## 2022-09-21 VITALS — BP 123/79 | HR 93 | Wt 222.0 lb

## 2022-09-21 DIAGNOSIS — Z3490 Encounter for supervision of normal pregnancy, unspecified, unspecified trimester: Secondary | ICD-10-CM | POA: Insufficient documentation

## 2022-09-21 DIAGNOSIS — Z3492 Encounter for supervision of normal pregnancy, unspecified, second trimester: Secondary | ICD-10-CM
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
## 2022-09-26 ENCOUNTER — Other Ambulatory Visit (HOSPITAL_COMMUNITY)
Admission: RE | Admit: 2022-09-26 | Discharge: 2022-09-26 | Disposition: A | Discharge status: Home / Self Care | Payer: Self-pay | Source: Ambulatory Visit | Attending: Family Medicine | Admitting: Family Medicine

## 2022-09-26 ENCOUNTER — Other Ambulatory Visit: Payer: Self-pay

## 2022-09-26 ENCOUNTER — Ambulatory Visit (INDEPENDENT_AMBULATORY_CARE_PROVIDER_SITE_OTHER): Payer: Self-pay | Admitting: Family Medicine

## 2022-09-26 VITALS — Wt 221.7 lb

## 2022-09-26 DIAGNOSIS — Z3492 Encounter for supervision of normal pregnancy, unspecified, second trimester: Secondary | ICD-10-CM | POA: Insufficient documentation

## 2022-09-26 DIAGNOSIS — O2342 Unspecified infection of urinary tract in pregnancy, second trimester: Secondary | ICD-10-CM

## 2022-09-26 DIAGNOSIS — Z9189 Other specified personal risk factors, not elsewhere classified: Secondary | ICD-10-CM

## 2022-09-26 DIAGNOSIS — F322 Major depressive disorder, single episode, severe without psychotic features: Secondary | ICD-10-CM

## 2022-09-26 MED ORDER — HYDROXYZINE HCL 25 MG PO TABS: 25.0000 mg | ORAL_TABLET | Freq: Four times a day (QID) | ORAL | 2 refills | Status: DC | PRN

## 2022-09-26 MED ORDER — CITALOPRAM HYDROBROMIDE 20 MG PO TABS
20.0000 mg | ORAL_TABLET | Freq: Every day | ORAL | 4 refills | Status: DC
Start: 2022-09-26 — End: 2022-11-23
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
## 2022-09-27 ENCOUNTER — Encounter: Payer: Self-pay | Admitting: Family Medicine

## 2022-09-27 DIAGNOSIS — O09899 Supervision of other high risk pregnancies, unspecified trimester: Secondary | ICD-10-CM | POA: Insufficient documentation

## 2022-09-27 DIAGNOSIS — Z2839 Other underimmunization status: Secondary | ICD-10-CM | POA: Insufficient documentation

## 2022-09-27 LAB — CBC/D/PLT+RPR+RH+ABO+RUBIGG...
Antibody Screen: NEGATIVE
Basophils Absolute: 0.1 10*3/uL (ref 0.0–0.2)
Basos: 0 %
EOS (ABSOLUTE): 0.1 10*3/uL (ref 0.0–0.4)
Eos: 0 %
HCV Ab: NONREACTIVE
HIV Screen 4th Generation wRfx: NONREACTIVE
Hematocrit: 42.1 % (ref 34.0–46.6)
Hemoglobin: 14.4 g/dL (ref 11.1–15.9)
Hepatitis B Surface Ag: NEGATIVE
Immature Grans (Abs): 0.1 10*3/uL (ref 0.0–0.1)
Immature Granulocytes: 1 %
Lymphocytes Absolute: 2.1 10*3/uL (ref 0.7–3.1)
Lymphs: 14 %
MCH: 30.6 pg (ref 26.6–33.0)
MCHC: 34.2 g/dL (ref 31.5–35.7)
MCV: 90 fL (ref 79–97)
Monocytes Absolute: 0.9 10*3/uL (ref 0.1–0.9)
Monocytes: 6 %
Neutrophils Absolute: 11.9 10*3/uL — ABNORMAL HIGH (ref 1.4–7.0)
Neutrophils: 79 %
Platelets: 289 10*3/uL (ref 150–450)
RBC: 4.7 x10E6/uL (ref 3.77–5.28)
RDW: 14.2 % (ref 11.7–15.4)
RPR Ser Ql: NONREACTIVE
Rh Factor: POSITIVE
Rubella Antibodies, IGG: <0.9 {index} — ABNORMAL LOW (ref >0.99)
WBC: 15.1 10*3/uL — ABNORMAL HIGH (ref 3.4–10.8)

## 2022-09-27 LAB — HCV INTERPRETATION

## 2022-09-28 LAB — AFP, SERUM, OPEN SPINA BIFIDA
AFP MoM: 1.01
AFP Value: 30.8 ng/mL
Gest. Age on Collection Date: 17 wk
Maternal Age At EDD: 24.1 a
OSBR Risk 1 IN: 10000
Test Results:: NEGATIVE
Weight: 221 [lb_av]

## 2022-09-28 LAB — CYTOLOGY - PAP
Chlamydia: NEGATIVE
Comment: NEGATIVE
Comment: NORMAL
Diagnosis: NEGATIVE
Neisseria Gonorrhea: NEGATIVE

## 2022-09-28 LAB — HEMOGLOBIN A1C
Est. average glucose Bld gHb Est-mCnc: 97 mg/dL
Hgb A1c MFr Bld: 5 % (ref 4.8–5.6)

## 2022-09-29 ENCOUNTER — Telehealth: Payer: Self-pay

## 2022-09-29 NOTE — Telephone Encounter (Addendum)
-----   Message from Federico Flake sent at 09/29/2022  8:53 AM EDT ----- NIL, next in 3 years   Called pt with [place] Spanish interpreter. VM left for patient. Called second listed number, no VM option. Patient called once previously today for UTI results and no answer. Will send letter with both results included.

## 2022-10-02 LAB — CULTURE, OB URINE

## 2022-10-02 LAB — URINE CULTURE, OB REFLEX

## 2022-10-03 ENCOUNTER — Telehealth: Payer: Self-pay | Admitting: General Practice

## 2022-10-03 DIAGNOSIS — O2342 Unspecified infection of urinary tract in pregnancy, second trimester: Secondary | ICD-10-CM | POA: Insufficient documentation

## 2022-10-03 MED ORDER — CEFADROXIL 500 MG PO CAPS: 500.0000 mg | ORAL_CAPSULE | Freq: Two times a day (BID) | ORAL | 0 refills | Status: DC

## 2022-10-03 NOTE — Telephone Encounter (Signed)
Called patient with Kellie Cook assisting with spanish interpretation, no answer- left message stating her most recent urine culture shows she has a UTI and we have sent antibiotics to her pharmacy for treatment. She may call us back if she has questions.

## 2022-10-03 NOTE — Telephone Encounter (Signed)
-----   Message from Federico Flake sent at 10/03/2022 10:59 AM EDT ----- UTI on culture, I have sent in cefadroxil to the [facility] in her chart. She need to take this twice a day for 7 days.

## 2022-10-03 NOTE — Addendum Note (Signed)
Addended by: Geanie Berlin on: 10/03/2022 11:00 AM   Modules accepted: Orders

## 2022-10-04 LAB — PANORAMA PRENATAL TEST FULL PANEL:PANORAMA TEST PLUS 5 ADDITIONAL MICRODELETIONS: FETAL FRACTION: 5.3

## 2022-10-04 LAB — HORIZON CUSTOM: REPORT SUMMARY: NEGATIVE

## 2022-10-24 ENCOUNTER — Ambulatory Visit (INDEPENDENT_AMBULATORY_CARE_PROVIDER_SITE_OTHER): Payer: Self-pay | Admitting: Obstetrics and Gynecology

## 2022-10-24 VITALS — BP 120/69 | HR 81 | Wt 224.5 lb

## 2022-10-24 DIAGNOSIS — Z2839 Other underimmunization status: Secondary | ICD-10-CM

## 2022-10-24 DIAGNOSIS — O09899 Supervision of other high risk pregnancies, unspecified trimester: Secondary | ICD-10-CM

## 2022-10-24 DIAGNOSIS — Z3492 Encounter for supervision of normal pregnancy, unspecified, second trimester: Secondary | ICD-10-CM

## 2022-10-24 DIAGNOSIS — Z3A21 21 weeks gestation of pregnancy: Secondary | ICD-10-CM

## 2022-10-24 DIAGNOSIS — O2342 Unspecified infection of urinary tract in pregnancy, second trimester: Secondary | ICD-10-CM

## 2022-10-24 DIAGNOSIS — F32A Depression, unspecified: Secondary | ICD-10-CM

## 2022-10-24 DIAGNOSIS — O99340 Other mental disorders complicating pregnancy, unspecified trimester: Secondary | ICD-10-CM

## 2022-10-24 MED ORDER — CEFADROXIL 500 MG PO CAPS
500.0000 mg | ORAL_CAPSULE | Freq: Two times a day (BID) | ORAL | 0 refills | Status: DC
Start: 2022-10-24 — End: 2022-11-23
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
## 2022-11-23 ENCOUNTER — Other Ambulatory Visit: Payer: Self-pay

## 2022-11-23 ENCOUNTER — Encounter: Payer: Self-pay | Admitting: *Deleted

## 2022-11-23 ENCOUNTER — Ambulatory Visit (INDEPENDENT_AMBULATORY_CARE_PROVIDER_SITE_OTHER): Payer: Self-pay | Admitting: Family Medicine

## 2022-11-23 VITALS — BP 117/83 | HR 103

## 2022-11-23 DIAGNOSIS — Z8719 Personal history of other diseases of the digestive system: Secondary | ICD-10-CM

## 2022-11-23 DIAGNOSIS — Z3492 Encounter for supervision of normal pregnancy, unspecified, second trimester: Secondary | ICD-10-CM

## 2022-11-23 DIAGNOSIS — O2342 Unspecified infection of urinary tract in pregnancy, second trimester: Secondary | ICD-10-CM

## 2022-11-23 DIAGNOSIS — Z2839 Other underimmunization status: Secondary | ICD-10-CM

## 2022-11-23 DIAGNOSIS — Z3A25 25 weeks gestation of pregnancy: Secondary | ICD-10-CM

## 2022-11-23 DIAGNOSIS — O09892 Supervision of other high risk pregnancies, second trimester: Secondary | ICD-10-CM

## 2022-11-23 DIAGNOSIS — F322 Major depressive disorder, single episode, severe without psychotic features: Secondary | ICD-10-CM

## 2022-11-23 DIAGNOSIS — Z8759 Personal history of other complications of pregnancy, childbirth and the puerperium: Secondary | ICD-10-CM

## 2022-11-23 DIAGNOSIS — Z9189 Other specified personal risk factors, not elsewhere classified: Secondary | ICD-10-CM

## 2022-11-23 MED ORDER — SERTRALINE HCL 25 MG PO TABS: 25.0000 mg | ORAL_TABLET | Freq: Every day | ORAL | 11 refills | Status: DC

## 2022-11-23 MED ORDER — HYDROXYZINE HCL 25 MG PO TABS: 25.0000 mg | ORAL_TABLET | Freq: Four times a day (QID) | ORAL | 2 refills | Status: DC | PRN

## 2022-11-23 MED ORDER — HYDROXYZINE HCL 25 MG PO TABS
25.0000 mg | ORAL_TABLET | Freq: Four times a day (QID) | ORAL | 2 refills | Status: DC | PRN
Start: 2022-11-23 — End: 2022-11-23

## 2022-11-24 NOTE — Progress Notes (Signed)
Pt did not keep [place] appt on 8/30 @ 4pm - states she was not aware of appt.  Rescheduled to 12/12/22 @ 10:30am.

## 2022-11-25 LAB — CULTURE, OB URINE

## 2022-11-25 LAB — URINE CULTURE, OB REFLEX: Organism ID, Bacteria: NO GROWTH

## 2022-12-14 ENCOUNTER — Other Ambulatory Visit: Payer: Self-pay

## 2022-12-15 ENCOUNTER — Ambulatory Visit (INDEPENDENT_AMBULATORY_CARE_PROVIDER_SITE_OTHER): Payer: Self-pay | Admitting: *Deleted

## 2022-12-15 ENCOUNTER — Other Ambulatory Visit: Payer: Self-pay

## 2022-12-15 ENCOUNTER — Encounter (HOSPITAL_COMMUNITY): Payer: Self-pay | Admitting: Obstetrics & Gynecology

## 2022-12-15 ENCOUNTER — Inpatient Hospital Stay (HOSPITAL_COMMUNITY)
Admission: AD | Admit: 2022-12-15 | Discharge: 2022-12-15 | Disposition: A | Discharge status: Home / Self Care | Payer: Self-pay | Attending: Obstetrics & Gynecology | Admitting: Obstetrics & Gynecology

## 2022-12-15 ENCOUNTER — Telehealth: Payer: Self-pay | Admitting: General Practice

## 2022-12-15 ENCOUNTER — Encounter: Payer: Self-pay | Admitting: *Deleted

## 2022-12-15 VITALS — BP 110/89 | HR 95 | Wt 236.6 lb

## 2022-12-15 DIAGNOSIS — R519 Headache, unspecified: Secondary | ICD-10-CM | POA: Insufficient documentation

## 2022-12-15 DIAGNOSIS — Z3A28 28 weeks gestation of pregnancy: Secondary | ICD-10-CM | POA: Insufficient documentation

## 2022-12-15 DIAGNOSIS — O26893 Other specified pregnancy related conditions, third trimester: Secondary | ICD-10-CM | POA: Insufficient documentation

## 2022-12-15 DIAGNOSIS — Z3490 Encounter for supervision of normal pregnancy, unspecified, unspecified trimester: Secondary | ICD-10-CM

## 2022-12-15 DIAGNOSIS — Z0130 Encounter for examination of blood pressure without abnormal findings: Secondary | ICD-10-CM

## 2022-12-15 DIAGNOSIS — O163 Unspecified maternal hypertension, third trimester: Secondary | ICD-10-CM

## 2022-12-15 DIAGNOSIS — O09899 Supervision of other high risk pregnancies, unspecified trimester: Secondary | ICD-10-CM

## 2022-12-15 DIAGNOSIS — Z9189 Other specified personal risk factors, not elsewhere classified: Secondary | ICD-10-CM

## 2022-12-15 LAB — URINALYSIS, ROUTINE W REFLEX MICROSCOPIC
Bacteria, UA: NONE SEEN
Bilirubin Urine: NEGATIVE
Glucose, UA: NEGATIVE mg/dL
Hgb urine dipstick: NEGATIVE
Ketones, ur: NEGATIVE mg/dL
Nitrite: NEGATIVE
Protein, ur: NEGATIVE mg/dL
Specific Gravity, Urine: 1.013 (ref 1.005–1.030)
pH: 6 (ref 5.0–8.0)

## 2022-12-15 LAB — COMPREHENSIVE METABOLIC PANEL
ALT: 21 U/L (ref 0–44)
AST: 17 U/L (ref 15–41)
Albumin: 2.5 g/dL — ABNORMAL LOW (ref 3.5–5.0)
Alkaline Phosphatase: 111 U/L (ref 38–126)
Anion gap: 8 (ref 5–15)
BUN: <5 mg/dL — ABNORMAL LOW (ref 6–20)
CO2: 23 mmol/L (ref 22–32)
Calcium: 8.7 mg/dL — ABNORMAL LOW (ref 8.9–10.3)
Chloride: 104 mmol/L (ref 98–111)
Creatinine, Ser: 0.35 mg/dL — ABNORMAL LOW (ref 0.44–1.00)
GFR, Estimated: >60 mL/min (ref >60)
Glucose, Bld: 81 mg/dL (ref 70–99)
Potassium: 3.8 mmol/L (ref 3.5–5.1)
Sodium: 135 mmol/L (ref 135–145)
Total Bilirubin: 0.5 mg/dL (ref <1.2)
Total Protein: 6.3 g/dL — ABNORMAL LOW (ref 6.5–8.1)

## 2022-12-15 LAB — PROTEIN / CREATININE RATIO, URINE
Creatinine, Urine: 75 mg/dL
Protein Creatinine Ratio: 0.11 mg/mg{creat} (ref 0.00–0.15)
Total Protein, Urine: 8 mg/dL

## 2022-12-15 LAB — CBC
HCT: 40 % (ref 36.0–46.0)
Hemoglobin: 13.1 g/dL (ref 12.0–15.0)
MCH: 28.9 pg (ref 26.0–34.0)
MCHC: 32.8 g/dL (ref 30.0–36.0)
MCV: 88.3 fL (ref 80.0–100.0)
Platelets: 301 10*3/uL (ref 150–400)
RBC: 4.53 MIL/uL (ref 3.87–5.11)
RDW: 14 % (ref 11.5–15.5)
WBC: 14.9 10*3/uL — ABNORMAL HIGH (ref 4.0–10.5)
nRBC: 0 % (ref 0.0–0.2)

## 2022-12-15 MED ORDER — ACETAMINOPHEN-CAFFEINE 500-65 MG PO TABS
2.0000 | ORAL_TABLET | Freq: Once | ORAL | Status: AC
Start: 2022-12-15 — End: 2022-12-15
  Administered 2022-12-15: 2 via ORAL
  Filled 2022-12-15: qty 2

## 2022-12-15 NOTE — Discharge Instructions (Signed)
It was a pleasure taking care of you today.  You had a couple of elevated blood pressures while here but have not yet ruled in for gestational hypertension.  I would like for you to have a blood pressure check in our clinic either tomorrow or Monday.  If you start having headaches that do not respond to medication, blurry vision, worsening swelling in your legs, pain in your right upper abdomen in which she will return for further evaluation.  I hope you have a wonderful night!  Fue un placer atenderte [employer].  Usted tuvo un par de presiones arteriales elevadas mientras estuvo aqu, pero an no se ha descartado hipertensin gestacional.  Me gustara que le hicieran un control de la presin arterial en nuestra clnica maana o el lunes.  Si comienza a Engineer, building services de cabeza que no responden a los medicamentos, visin borrosa, Corporate treasurer de Museum/gallery curator en las piernas, Engineer, mining en la parte superior derecha del abdomen en el cual regresar para una evaluacin adicional.  Espero que tengas una noche Kellie Cook!

## 2022-12-15 NOTE — Progress Notes (Deleted)
Here for c/o headache x 2 weeks continuously =10. States she is taking Tylenol 1000mg  TID and it does

## 2022-12-15 NOTE — Telephone Encounter (Signed)
Called patient with Eda concerning a reported headache. Patient states it started 2 weeks ago with a headache daily, rated at a 10. She states rest doesn't help and tylenol 1000mg  helps minimally but she states it TID. Reports blurry vision & nausea. Offered nurse visit today to check her blood pressure & scheduled appt at 11am. Patient verbalized understanding.

## 2022-12-15 NOTE — MAU Provider Note (Signed)
History   Chief Complaint  Patient presents with   Headache   Hypertension   HPI Patient is a 22 yo G3P2 at [redacted]w[redacted]d who presents to the MAU with 2 weeks of worsening headaches that have been unrelieved by tylenol who was sent from [facility] health clinic for increased BP. Her headache is mainly in the front and into her eyes. She endorses photophobia. Denies nausea or vomiting, RUQ or abdominal pain. She feels fetal movement, with no leakage of fluid, bleeding, or contractions. She reports that she has some SOB with walking and some chest pain when she lays down at night, which she thinks is due to her increased body weight. Denies any current CP or SOB. She does not remember if she had elevated Bps in previous pregnancies. She admits that she has been drinking less water recently because she has been urinating frequently. She also endorses some vaginal pain with walking for the past 3 months.  OB History     Gravida  3   Para  2   Term  2   Preterm  0   AB  0   Living  2      SAB  0   IAB  0   Ectopic  0   Multiple  0   Live Births  2           Past Medical History:  Diagnosis Date   Cholestasis during pregnancy in third trimester     History reviewed. No pertinent surgical history.  Family History  Problem Relation Age of Onset   Heart disease Father     Social History   Tobacco Use   Smoking status: Former    Types: E-cigarettes   Smokeless tobacco: Never  Vaping Use   Vaping status: Never Used  Substance Use Topics   Alcohol use: No   Drug use: No    Allergies: No Known Allergies  Medications Prior to Admission  Medication Sig Dispense Refill Last Dose   sertraline (ZOLOFT) 25 MG tablet Take 1 tablet (25 mg total) by mouth daily. 30 tablet 11 Past Week   hydrOXYzine (ATARAX) 25 MG tablet Take 1 tablet (25 mg total) by mouth every 6 (six) hours as needed for anxiety. 30 tablet 2    Melatonin 1 MG CAPS Take by mouth.      Prenatal Vit-Fe  Fumarate-FA (PRENATAL MULTIVITAMIN) TABS tablet Take 1 tablet by mouth daily.        Review of Systems Physical Exam Blood pressure 120/75, pulse 92, temperature 99.1 F (37.3 C), temperature source Oral, resp. rate 16, height 4\' 11"  (1.499 m), weight 101.2 kg, SpO2 99%, unknown if currently breastfeeding. Physical Exam  MAU Course Procedures  MDM #Headache Patient's headache is likely secondary to dehydration. BP, urinalysis, CMP and urine protein/creatinine are WNL with no RUQ pain, making preeclampsia less likely. CBC with no anemia and slight leukocytosis, not concerned about infection given lack of other symptoms. Counseled patient to continue to hydrate. May continue taking tylenol as needed for headaches.
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
## 2022-12-15 NOTE — MAU Note (Signed)
Provided patient with an at home blood pressure cuff. Cuff fits patient appropriately. Reviewed normal range BP's, elevated BP's, and severe range BP's. Questions answered. Instructed the patient to take her BP cuff with her to the office and they will get her set up with baby scripts. Patient verbalized understanding.

## 2022-12-15 NOTE — Progress Notes (Signed)
Here for c/o headache x 2 weeks continuously =10. States she is taking Tylenol 1000mg  TID and it does not help . No edema noted but c/o edema if she walks a lot. BP today 139/114, repeat 110/89. C/o blurry vision and eyes hurt.  Also c/o something is weird about this pregnancy. States she does't feel connected to this baby and wishes it could be born soon. She denies thoughts of hurting self or baby. Offered referral to [facility] or talk with doctor today - she declined both. She later reported significant other has left her. Support given.  Discussed assessment with Dr. Crissie Reese who advised patient go to MAU for evaluation of possible pre-eclampsia. I informed patient and she states she has to pick up child at school at 2:20. I reviewed danger of pre-ecclampsia to her and her baby and advised she go to MAU and stay as long as needed and consider if there is someone she can call if needed. She reports she will go to MAU for evaluation.  Report called to MAU charge RN and Dr. Nobie Putnam.

## 2022-12-19 ENCOUNTER — Ambulatory Visit: Payer: Self-pay

## 2022-12-21 ENCOUNTER — Ambulatory Visit (INDEPENDENT_AMBULATORY_CARE_PROVIDER_SITE_OTHER): Payer: Self-pay | Admitting: Family Medicine

## 2022-12-21 ENCOUNTER — Other Ambulatory Visit: Payer: Self-pay

## 2022-12-21 VITALS — BP 110/55 | HR 116 | Wt 237.0 lb

## 2022-12-21 DIAGNOSIS — Z3A29 29 weeks gestation of pregnancy: Secondary | ICD-10-CM

## 2022-12-21 DIAGNOSIS — Z803 Family history of malignant neoplasm of breast: Secondary | ICD-10-CM

## 2022-12-21 DIAGNOSIS — Z9189 Other specified personal risk factors, not elsewhere classified: Secondary | ICD-10-CM

## 2022-12-21 DIAGNOSIS — Z3493 Encounter for supervision of normal pregnancy, unspecified, third trimester: Secondary | ICD-10-CM

## 2022-12-21 DIAGNOSIS — Z23 Encounter for immunization: Secondary | ICD-10-CM

## 2022-12-21 DIAGNOSIS — Z8759 Personal history of other complications of pregnancy, childbirth and the puerperium: Secondary | ICD-10-CM

## 2022-12-21 DIAGNOSIS — Z8719 Personal history of other diseases of the digestive system: Secondary | ICD-10-CM
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
## 2022-12-21 NOTE — Addendum Note (Signed)
Addended by: Jill Side on: 12/21/2022 01:42 PM   Modules accepted: Orders

## 2022-12-22 ENCOUNTER — Other Ambulatory Visit: Payer: Self-pay

## 2023-01-11 ENCOUNTER — Telehealth: Payer: Self-pay | Admitting: Family Medicine

## 2023-01-11 NOTE — Telephone Encounter (Signed)
Patient thinks she has a vaginal infection and is [redacted] weeks pregnant, would like a nurse to call back

## 2023-01-11 NOTE — Telephone Encounter (Signed)
Called patient using Editor, commissioning [phone]. Verified patient using full name and DOB. Called regarding a call we received this morning about having a vaginal infection. Patient reports having burning sensation while voiding and some vaginal pain with some clear thin discharge after voiding. Patient denies any odor or itchiness. Advised patient to come in for an appointment at 3:00 PM to check for a urine sample and possible vaginal swab. Patient refused appointment due to having to work. Advised patient the importance of getting checked for these symptoms. Patient would like a appointment next week instead. Appointment made for Tuesday, 12/10 at 2:00 PM. Advised patient to go to the MAU if these symptoms persist. Patient acknowledge understanding.

## 2023-01-17 ENCOUNTER — Ambulatory Visit: Payer: Self-pay

## 2023-01-18 ENCOUNTER — Telehealth: Payer: Self-pay | Admitting: *Deleted

## 2023-01-18 ENCOUNTER — Ambulatory Visit: Payer: Self-pay | Admitting: Family Medicine

## 2023-01-18 NOTE — Progress Notes (Signed)
Patient did not show for appointment.

## 2023-01-18 NOTE — Telephone Encounter (Signed)
Patient missed CenteringPregnancy prenatal visit today. Interpreter called patient to let her know and patient reports she does not feel well, severe headache x 2-3 days and lots of watery discharge from vagina. Nurse advised patient to go to [facility] MAU for evaluation immediately. She voices understanding.  Nancy Fetter

## 2023-01-23 ENCOUNTER — Telehealth: Payer: Self-pay | Admitting: Family Medicine

## 2023-01-23 NOTE — Telephone Encounter (Signed)
Patient called in regarding pain and a lot of pressure she is feelings, she also states hs eis not feeling well. Has an appointment with [place] on 02/02/23.

## 2023-01-24 NOTE — Telephone Encounter (Signed)
Returned call to patient with Kellie Cook (CAP interpreter). Patient reports she has been having contractions every 5 minutes intermittently over past 2 weeks. Regular contractions all day yesterday, but now today more irrregular. Reports good fetal movement. Denies bleeding. Reports leaking water like fluid for past 8 days. Recommended pt go to MAU for rule out of ruptured membranes. MAU providers notified.  Fleet Contras RN 01/24/23

## 2023-01-25 ENCOUNTER — Inpatient Hospital Stay (HOSPITAL_COMMUNITY)
Admission: AD | Admit: 2023-01-25 | Discharge: 2023-01-25 | Disposition: A | Discharge status: Home / Self Care | Payer: Self-pay | Attending: Obstetrics & Gynecology | Admitting: Obstetrics & Gynecology

## 2023-01-25 ENCOUNTER — Encounter (HOSPITAL_COMMUNITY): Payer: Self-pay | Admitting: Obstetrics & Gynecology

## 2023-01-25 ENCOUNTER — Inpatient Hospital Stay (HOSPITAL_COMMUNITY): Payer: Self-pay

## 2023-01-25 DIAGNOSIS — O368130 Decreased fetal movements, third trimester, not applicable or unspecified: Secondary | ICD-10-CM

## 2023-01-25 DIAGNOSIS — Z0371 Encounter for suspected problem with amniotic cavity and membrane ruled out: Secondary | ICD-10-CM

## 2023-01-25 DIAGNOSIS — O26893 Other specified pregnancy related conditions, third trimester: Secondary | ICD-10-CM | POA: Insufficient documentation

## 2023-01-25 DIAGNOSIS — Z3A34 34 weeks gestation of pregnancy: Secondary | ICD-10-CM

## 2023-01-25 DIAGNOSIS — R102 Pelvic and perineal pain: Secondary | ICD-10-CM

## 2023-01-25 DIAGNOSIS — F17290 Nicotine dependence, other tobacco product, uncomplicated: Secondary | ICD-10-CM | POA: Insufficient documentation

## 2023-01-25 DIAGNOSIS — O99333 Smoking (tobacco) complicating pregnancy, third trimester: Secondary | ICD-10-CM | POA: Insufficient documentation

## 2023-01-25 DIAGNOSIS — O212 Late vomiting of pregnancy: Secondary | ICD-10-CM | POA: Insufficient documentation

## 2023-01-25 DIAGNOSIS — O26899 Other specified pregnancy related conditions, unspecified trimester: Secondary | ICD-10-CM

## 2023-01-25 DIAGNOSIS — O4703 False labor before 37 completed weeks of gestation, third trimester: Secondary | ICD-10-CM | POA: Insufficient documentation

## 2023-01-25 LAB — URINALYSIS, ROUTINE W REFLEX MICROSCOPIC
Bilirubin Urine: NEGATIVE
Glucose, UA: NEGATIVE mg/dL
Hgb urine dipstick: NEGATIVE
Ketones, ur: NEGATIVE mg/dL
Leukocytes,Ua: NEGATIVE
Nitrite: NEGATIVE
Protein, ur: NEGATIVE mg/dL
Specific Gravity, Urine: 1.013 (ref 1.005–1.030)
pH: 6 (ref 5.0–8.0)

## 2023-01-25 LAB — POCT FERN TEST: POCT Fern Test: NEGATIVE

## 2023-01-25 LAB — WET PREP, GENITAL
Clue Cells Wet Prep HPF POC: NONE SEEN
Sperm: NONE SEEN
Trich, Wet Prep: NONE SEEN
WBC, Wet Prep HPF POC: <10 (ref <10)
Yeast Wet Prep HPF POC: NONE SEEN

## 2023-01-25 LAB — RUPTURE OF MEMBRANE (ROM)PLUS: Rom Plus: NEGATIVE

## 2023-01-25 MED ORDER — ACETAMINOPHEN 500 MG PO TABS
1000.0000 mg | ORAL_TABLET | Freq: Once | ORAL | Status: AC
  Administered 2023-01-25: 1000 mg via ORAL
  Filled 2023-01-25: qty 2

## 2023-01-25 MED ORDER — CYCLOBENZAPRINE HCL 5 MG PO TABS
10.0000 mg | ORAL_TABLET | Freq: Once | ORAL | Status: AC
  Administered 2023-01-25: 10 mg via ORAL
  Filled 2023-01-25: qty 2

## 2023-01-25 MED ORDER — NIFEDIPINE 10 MG PO CAPS: 10.0000 mg | ORAL_CAPSULE | ORAL | Status: DC | PRN

## 2023-01-25 MED ORDER — CYCLOBENZAPRINE HCL 10 MG PO TABS: 10.0000 mg | ORAL_TABLET | Freq: Three times a day (TID) | ORAL | 2 refills | Status: DC | PRN

## 2023-01-25 NOTE — MAU Provider Note (Signed)
MAU Provider Note  Chief Complaint: Rupture of Membranes, Emesis, Contractions, and Decreased Fetal Movement   Provider seen [phone]     SUBJECTIVE HPI: Kellie Cook is a 22 y.o. G3P2002 at [redacted]w[redacted]d by early ultrasound who presents to maternity admissions reporting multiple concerns. Overall not feeling well and leaking. Pregnancy c/b hx cholestasis in prior pregnancy, RNI. Receives [facility] with [facility]-centering.  Patient notes leaking clear fluid for past ~2 weeks. No gush of fluid. Denies VB, change in discharge otherwise, urinary symptoms, fever/chills. Having more pelvic pressure and rare contractions. Feeling weak. No sick contacts. Baby's movement is different than previous. Feels like less.  HPI  Past Medical History:  Diagnosis Date   Cholestasis during pregnancy in third trimester    History reviewed. No pertinent surgical history. Social History   Socioeconomic History   Marital status: Single    Spouse name: Not on file   Number of children: Not on file   Years of education: Not on file   Highest education level: Not on file  Occupational History   Not on file  Tobacco Use   Smoking status: Former    Types: E-cigarettes   Smokeless tobacco: Never  Vaping Use   Vaping status: Never Used  Substance and Sexual Activity   Alcohol use: No   Drug use: No   Sexual activity: Not Currently    Birth control/protection: None  Other Topics Concern   Not on file  Social History Narrative   Not on file   Social Drivers of Health   Financial Resource Strain: Not on file  Food Insecurity: Not on file  Transportation Needs: Not on file  Physical Activity: Not on file  Stress: Not on file  Social Connections: Not on file  Intimate Partner Violence: Not on file   No current facility-administered medications on file prior to encounter.   Current Outpatient Medications on File Prior to Encounter  Medication Sig Dispense Refill   Prenatal Vit-Fe Fumarate-FA (PRENATAL  MULTIVITAMIN) TABS tablet Take 1 tablet by mouth daily.      hydrOXYzine (ATARAX) 25 MG tablet Take 1 tablet (25 mg total) by mouth every 6 (six) hours as needed for anxiety. 30 tablet 2   Melatonin 1 MG CAPS Take by mouth.     sertraline (ZOLOFT) 25 MG tablet Take 1 tablet (25 mg total) by mouth daily. 30 tablet 11   No Known Allergies  ROS:  Pertinent positives/negatives listed above.  I have reviewed patient's Past Medical Hx, Surgical Hx, Family Hx, Social Hx, medications and allergies.   Physical Exam  Patient Vitals for the past 24 hrs:  BP Temp Temp src Pulse Resp SpO2 Height Weight  01/25/23 1100 124/62 -- -- 97 -- -- -- --  01/25/23 0920 (!) 124/59 -- -- (!) 107 -- -- -- --  01/25/23 0858 127/85 98 F (36.7 C) Oral (!) 109 18 98 % 4\' 11"  (1.499 m) 110.2 kg   Constitutional: Well-developed, well-nourished female in no acute distress  Cardiovascular: normal rate Respiratory: normal effort GI: Abd soft, non-tender MS: Extremities nontender, no edema, normal ROM Neurologic: Alert and oriented x 4   PELVIC EXAM: Cervix pink, visually ~1 cm open, without lesion, scant white creamy discharge, vaginal walls and external genitalia normal. Scant clear fluid in posterior fornix  FHT:  Baseline 135, moderate variability, accelerations present, very shallow variable Contractions: rare  LAB RESULTS Results for orders placed or performed during the hospital encounter of 01/25/23 (from the past 24 hours)  Urinalysis, Routine w reflex microscopic -Urine, Clean Catch     Status: Abnormal   Collection Time: 01/25/23  9:13 AM  Result Value Ref Range   Color, Urine YELLOW YELLOW   APPearance HAZY (A) CLEAR   Specific Gravity, Urine 1.013 1.005 - 1.030   pH 6.0 5.0 - 8.0   Glucose, UA NEGATIVE NEGATIVE mg/dL   Hgb urine dipstick NEGATIVE NEGATIVE   Bilirubin Urine NEGATIVE NEGATIVE   Ketones, ur NEGATIVE NEGATIVE mg/dL   Protein, ur NEGATIVE NEGATIVE mg/dL   Nitrite NEGATIVE NEGATIVE    Leukocytes,Ua NEGATIVE NEGATIVE  Wet prep, genital     Status: None   Collection Time: 01/25/23  9:50 AM  Result Value Ref Range   Yeast Wet Prep HPF POC NONE SEEN NONE SEEN   Trich, Wet Prep NONE SEEN NONE SEEN   Clue Cells Wet Prep HPF POC NONE SEEN NONE SEEN   WBC, Wet Prep HPF POC <10 <10   Sperm NONE SEEN   Rupture of Membrane (ROM) Plus     Status: None   Collection Time: 01/25/23  9:50 AM  Result Value Ref Range   Rom Plus NEGATIVE   Fern Test     Status: None   Collection Time: 01/25/23 10:17 AM  Result Value Ref Range   POCT Fern Test Negative = intact amniotic membranes     O/Positive/-- (08/19 1616)  IMAGING No results found.  MAU Management/MDM: Orders Placed This Encounter  Procedures   Wet prep, genital   [place] MFM FETAL BPP WO NON STRESS   Urinalysis, Routine w reflex microscopic -Urine, Clean Catch   Rupture of Membrane (ROM) Plus   Fern Test   Discharge patient    Meds ordered this encounter  Medications   acetaminophen (TYLENOL) tablet 1,000 mg   cyclobenzaprine (FLEXERIL) tablet 10 mg   NIFEdipine (PROCARDIA) capsule 10 mg   cyclobenzaprine (FLEXERIL) 10 MG tablet    Sig: Take 1 tablet (10 mg total) by mouth 3 (three) times daily as needed for muscle spasms.    Dispense:  30 tablet    Refill:  2     Available prenatal records reviewed.  Patient is 34.4 weeks with low-risk pregnancy. She presents with multiple concerns today.  #LOF: Speculum exam without pooling. Scant fluid. ROM plus, fern, wet prep, GC/C sent. Reassuringly all returned negative. Ruled-out PPROM.  #DFM: BPP 8/8 with reactive tracing.  #Pelvic pressure: Discussed that this is her 3rd pregnancy and pelvic pressure comes earlier and earlier with each pregnancy. Will rule-out vaginitis, UTI. Rare ctx on toco. Flexeril, tylenol for symptoms.  1142: Reassessed patient at bedside. Discussed reassuring results. She does feel improved with tylenol, flexeril for a pelvic pressure  standpoint. Does still feel fatigued. Discussed reassuring fetal status as well. Has next OB appointment 12/26. Encouraged her to call the office or come here for evaluation if she has any concerns between now and then.  ASSESSMENT 1. Encounter for suspected premature rupture of amniotic membranes, with rupture of membranes not found   2. Decreased fetal movements in third trimester, single or unspecified fetus   3. Pelvic pressure in pregnancy   4. [redacted] weeks gestation of pregnancy     PLAN Discharge home with strict return precautions. Allergies as of 01/25/2023   No Known Allergies      Medication List     TAKE these medications    cyclobenzaprine 10 MG tablet Commonly known as: FLEXERIL Take 1 tablet (10 mg total) by mouth  3 (three) times daily as needed for muscle spasms.   hydrOXYzine 25 MG tablet Commonly known as: ATARAX Take 1 tablet (25 mg total) by mouth every 6 (six) hours as needed for anxiety.   Melatonin 1 MG Caps Take by mouth.   prenatal multivitamin Tabs tablet Take 1 tablet by mouth daily.   sertraline 25 MG tablet Commonly known as: Zoloft Take 1 tablet (25 mg total) by mouth daily.         Wylene Simmer, MD OB Fellow 01/25/2023  11:52 AM

## 2023-01-25 NOTE — MAU Note (Addendum)
Kellie Cook is a 22 y.o. at [redacted]w[redacted]d here in MAU reporting: having vomiting, feeling weak .  Feeling contractions in abd  and pelvic pressure. ? Leaking (clear watery)- has been telling dr she has been leaking for 2 wks. No bleeding. Says baby isn't moving-not in a normal way, does feel movement- just different.  Onset of complaint: 2 wks Pain score: abd 7-8, back 8 Vitals:   01/25/23 0858  BP: 127/85  Pulse: (!) 109  Resp: 18  Temp: 98 F (36.7 C)  SpO2: 98%     FHT:148 Lab orders placed from triage:

## 2023-01-26 LAB — GC/CHLAMYDIA PROBE AMP (~~LOC~~) NOT AT ARMC
Chlamydia: NEGATIVE
Comment: NEGATIVE
Comment: NORMAL
Neisseria Gonorrhea: NEGATIVE

## 2023-02-01 NOTE — Progress Notes (Signed)
Pt did not attend visit

## 2023-02-02 ENCOUNTER — Other Ambulatory Visit: Payer: Self-pay

## 2023-02-02 ENCOUNTER — Ambulatory Visit: Payer: Self-pay | Admitting: Certified Nurse Midwife

## 2023-02-02 ENCOUNTER — Telehealth: Payer: Self-pay | Admitting: Family Medicine

## 2023-02-02 DIAGNOSIS — Z2839 Other underimmunization status: Secondary | ICD-10-CM

## 2023-02-02 DIAGNOSIS — Z3493 Encounter for supervision of normal pregnancy, unspecified, third trimester: Secondary | ICD-10-CM

## 2023-02-02 DIAGNOSIS — Z758 Other problems related to medical facilities and other health care: Secondary | ICD-10-CM

## 2023-02-02 DIAGNOSIS — Z3A35 35 weeks gestation of pregnancy: Secondary | ICD-10-CM

## 2023-02-02 DIAGNOSIS — Z8719 Personal history of other diseases of the digestive system: Secondary | ICD-10-CM

## 2023-02-02 NOTE — Telephone Encounter (Signed)
Patient refused to wait for her appt and walked out. She was called to remind her of her appt on Wednesday for her centering.

## 2023-02-08 NOTE — L&D Delivery Note (Addendum)
OB/GYN Faculty Practice Delivery Note  Valentine Antony is a 23 y.o. [phone] s/p SVD at [redacted]w[redacted]d. She was admitted for IOL for cholestasis.   ROM: 4h 6m with clear fluid GBS Status: Negative/-- (01/13 1658) Maximum Maternal Temperature: Temp (24hrs), Avg:97.9 F (36.6 C), Min:97.4 F (36.3 C), Max:98.3 F (36.8 C)   Labor Progress: Initial SVE: 4/50/ballotable. AROM and pitocin required. She then progressed to complete.   Delivery Date/Time: 02/24/23 @1354  Delivery: Called to room and patient was complete and pushing. Head delivered OA to ROA over several pushes. No nuchal cord present. Despite excellent maternal effort, shoulders did not spontaneously deliver within 30 seconds and McRobert's. Babe delivered with delivery of posterior arm (right). Shoulder dystocia for 45 seconds. Body delivered in usual fashion. Cord was wrapped around right foot. Infant with spontaneous cry, dried and stimulated, brought to warmer. Cord clamped x 2 upon delivery, and cut by provider. Babe moved to warmer for further resuscitation efforts. Cord gases not obtained. Cord blood drawn. Placenta delivered spontaneously with gentle cord traction. Fundus firm with massage and Pitocin. Labia, perineum, and vagina inspected with 1st degree perineal which were hemostatic and did not require repair. Babe returned for skin-to-skin. Mom and baby to postpartum. Baby Weight: 3560g  Placenta: 3 vessel, intact. Sent to L&D Complications: Shoulder dystocia for 45 seconds Lacerations: 1st degree perineal EBL: 76 mL Anesthesia: epidural  Infant: baby girl Chloe APGAR 1 minute: 8 APGAR 5 minute: 9  Joanne Gavel, MD
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
## 2023-02-15 ENCOUNTER — Ambulatory Visit (INDEPENDENT_AMBULATORY_CARE_PROVIDER_SITE_OTHER): Payer: Self-pay | Admitting: Family Medicine

## 2023-02-15 ENCOUNTER — Encounter: Payer: Self-pay | Admitting: Family Medicine

## 2023-02-15 VITALS — BP 105/69 | HR 112 | Wt 252.0 lb

## 2023-02-15 DIAGNOSIS — O09899 Supervision of other high risk pregnancies, unspecified trimester: Secondary | ICD-10-CM

## 2023-02-15 DIAGNOSIS — Z9189 Other specified personal risk factors, not elsewhere classified: Secondary | ICD-10-CM

## 2023-02-15 DIAGNOSIS — O09893 Supervision of other high risk pregnancies, third trimester: Secondary | ICD-10-CM

## 2023-02-15 DIAGNOSIS — Z603 Acculturation difficulty: Secondary | ICD-10-CM

## 2023-02-15 DIAGNOSIS — Z3493 Encounter for supervision of normal pregnancy, unspecified, third trimester: Secondary | ICD-10-CM

## 2023-02-15 DIAGNOSIS — Z758 Other problems related to medical facilities and other health care: Secondary | ICD-10-CM

## 2023-02-15 DIAGNOSIS — Z3A37 37 weeks gestation of pregnancy: Secondary | ICD-10-CM

## 2023-02-15 DIAGNOSIS — Z2839 Other underimmunization status: Secondary | ICD-10-CM

## 2023-02-15 DIAGNOSIS — Z8759 Personal history of other complications of pregnancy, childbirth and the puerperium: Secondary | ICD-10-CM

## 2023-02-15 DIAGNOSIS — Z8719 Personal history of other diseases of the digestive system: Secondary | ICD-10-CM

## 2023-02-15 DIAGNOSIS — Z803 Family history of malignant neoplasm of breast: Secondary | ICD-10-CM

## 2023-02-15 NOTE — Progress Notes (Addendum)
 Pt reports UC's q8 min apart today. She is upset that she was not seen by provider on 02/02/23.

## 2023-02-16 NOTE — Progress Notes (Signed)
 Pt was offered RSV vaccine and she declined.

## 2023-02-20 ENCOUNTER — Ambulatory Visit (INDEPENDENT_AMBULATORY_CARE_PROVIDER_SITE_OTHER): Payer: Self-pay | Admitting: Family Medicine

## 2023-02-20 ENCOUNTER — Other Ambulatory Visit: Payer: Self-pay

## 2023-02-20 VITALS — BP 120/82 | HR 96 | Wt 250.1 lb

## 2023-02-20 DIAGNOSIS — Z9189 Other specified personal risk factors, not elsewhere classified: Secondary | ICD-10-CM

## 2023-02-20 DIAGNOSIS — Z3493 Encounter for supervision of normal pregnancy, unspecified, third trimester: Secondary | ICD-10-CM

## 2023-02-20 DIAGNOSIS — Z8759 Personal history of other complications of pregnancy, childbirth and the puerperium: Secondary | ICD-10-CM

## 2023-02-20 DIAGNOSIS — L299 Pruritus, unspecified: Secondary | ICD-10-CM

## 2023-02-20 DIAGNOSIS — Z3A38 38 weeks gestation of pregnancy: Secondary | ICD-10-CM

## 2023-02-20 DIAGNOSIS — Z603 Acculturation difficulty: Secondary | ICD-10-CM

## 2023-02-20 DIAGNOSIS — Z8719 Personal history of other diseases of the digestive system: Secondary | ICD-10-CM

## 2023-02-20 DIAGNOSIS — Z758 Other problems related to medical facilities and other health care: Secondary | ICD-10-CM
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
## 2023-02-21 ENCOUNTER — Inpatient Hospital Stay (HOSPITAL_COMMUNITY)
Admission: AD | Admit: 2023-02-21 | Discharge: 2023-02-21 | Disposition: A | Discharge status: Home / Self Care | Payer: Self-pay | Attending: Obstetrics and Gynecology | Admitting: Obstetrics and Gynecology

## 2023-02-21 ENCOUNTER — Telehealth: Payer: Self-pay | Admitting: Family Medicine

## 2023-02-21 ENCOUNTER — Encounter (HOSPITAL_COMMUNITY): Payer: Self-pay | Admitting: Obstetrics and Gynecology

## 2023-02-21 DIAGNOSIS — Z3A38 38 weeks gestation of pregnancy: Secondary | ICD-10-CM | POA: Insufficient documentation

## 2023-02-21 DIAGNOSIS — Z603 Acculturation difficulty: Secondary | ICD-10-CM | POA: Diagnosis present

## 2023-02-21 DIAGNOSIS — Z2839 Other underimmunization status: Secondary | ICD-10-CM

## 2023-02-21 DIAGNOSIS — Z8719 Personal history of other diseases of the digestive system: Secondary | ICD-10-CM

## 2023-02-21 DIAGNOSIS — Z758 Other problems related to medical facilities and other health care: Secondary | ICD-10-CM | POA: Diagnosis present

## 2023-02-21 DIAGNOSIS — O471 False labor at or after 37 completed weeks of gestation: Secondary | ICD-10-CM | POA: Insufficient documentation

## 2023-02-21 NOTE — Discharge Instructions (Signed)
  Early Labor Tips   Rest when able, especially if your labor starts at night. Side lying positions can feel better than lying on your back  Move around if you aren't able to rest or if your labor starts at night.  Gentle movement, upright or forward-leaning positions can help your baby to be in a good position and put more pressure on your cervix so it can open. Hip circles standing or on a birth ball can help.  Drink water and eat easily digestible snacks. You may also consider an electrolyte drink (like Gatorade) if you are nauseated or do not have an appetite. Distract yourself with a movie, podcasts, walking outside, music, etc.   The [place] is a series of exercises you can do in early labor to encourage your baby into a better position to make progress in labor.  It is available with pictures at themilescircuit.com  Pain management at home  Try any relaxation and breathing techniques you have learned in antenatal classes. Have a massage. Your birth partner could help by rubbing your back. Take acetaminophen  (Tylenol ) according to the instructions on the packet - it's safe to take in labour. Have a warm bath or shower.   Consejos para el parto temprano   1. Descanse cuando pueda, especialmente si el parto comienza por la noche. Las posiciones acostadas de lado pueden resultar mejores que acostarse boca arriba  2. Muvase si no puede descansar o si el parto comienza por la noche.  3. Los movimientos suaves y las posiciones erguidas o inclinadas hacia adelante pueden ayudar a que su beb est en una buena posicin y occupational hygienist ms presin sobre el cuello uterino para que pueda abrirse. 4. Los crculos de cadera de pie o sobre una pelota de parto pueden ayudar.  5. Renea schmitz y coma bocadillos de fcil digestin. Tambin puede considerar una bebida con electrolitos (como Gatorade) si tiene nuseas o no tiene apetito. 6. Distrete con ignacia course, podcasts, caminar al doss rasher,  [place], etc.   El [employer] [place] serie de ejercicios que puede realizar al comienzo del parto para alentar a su beb a adoptar una mejor posicin para environmental education officer.  Est disponible con fotografas en retailcleaners.fi.  Manejo del dolor en casa.  1. Pruebe cualquier tcnica de relajacin y respiracin que haya aprendido en las clases prenatales. 2. Date un masaje. Su compaero de parto podra ayudar frotndole la espalda. 3. Tome acetaminofn (Tylenol ) segn las instrucciones del paquete; es seguro tomarlo durante el [place]. 4. Tome un bao o una ducha caliente.

## 2023-02-21 NOTE — MAU Provider Note (Signed)
 None     S [name] is a 23 y.o. [phone] pregnant female at [redacted]w[redacted]d who presents to MAU today with complaint of contractions approximately every 6 minutes. She reports that she was not feeling baby move well at home, she reports that now that she is here she is feeling baby move well. She knows to click the fetal movement tracker.   She was seen in the office for swelling of hands and feet and itching of the palms. She reports she is still feeling the itching. She has not tried anything for the itching, she reports she is not interested in any medications today. She reports pain in her back and lower abdomen with contractions.   Receives care at [facility], Centering. Prenatal records reviewed.  Pertinent items noted in HPI and remainder of comprehensive ROS otherwise negative.   O BP 105/64 (BP Location: Right Arm)   Pulse (!) 106  Physical Exam Vitals reviewed.  Constitutional:      General: She is not in acute distress.    Appearance: Normal appearance. She is not ill-appearing, toxic-appearing or diaphoretic.  HENT:     Head: Normocephalic.  Cardiovascular:     Rate and Rhythm: Normal rate.     Pulses: Normal pulses.  Pulmonary:     Effort: Pulmonary effort is normal.  Skin:    General: Skin is warm and dry.     Capillary Refill: Capillary refill takes less than 2 seconds.  Neurological:     Mental Status: She is alert and oriented to person, place, and time.  Psychiatric:        Mood and Affect: Mood normal.        Behavior: Behavior normal.        Thought Content: Thought content normal.        Judgment: Judgment normal.    No results found for this or any previous visit (from the past 24 hours).  MDM: MAU Course:  A False labor  Medical screening exam complete  P Discharge from MAU in stable condition with labor precautions. Fetal movement counts/awareness counseled. Latent labor coping counseled and provided in AVS, including [employer].  Follow  up at [facility] as scheduled for ongoing prenatal care  Future Appointments  Date Time Provider Department Center  03/01/2023  9:00 AM CENTERING PROVIDER [facility]  03/15/2023  9:00 AM CENTERING PROVIDER [facility]   Allergies as of 02/21/2023   No Known Allergies      Medication List     TAKE these medications    cyclobenzaprine  10 MG tablet Commonly known as: FLEXERIL  Take 1 tablet (10 mg total) by mouth 3 (three) times daily as needed for muscle spasms.   hydrOXYzine  25 MG tablet Commonly known as: ATARAX  Take 1 tablet (25 mg total) by mouth every 6 (six) hours as needed for anxiety.   Melatonin 1 MG Caps Take by mouth.   prenatal multivitamin Tabs tablet Take 1 tablet by mouth daily.   sertraline  25 MG tablet Commonly known as: Zoloft  Take 1 tablet (25 mg total) by mouth daily.      - Both remote and in-person Spanish interpreters used for entire episode of care.    Regino Camie LABOR, CNM 02/21/2023 12:16 PM

## 2023-02-21 NOTE — MAU Note (Signed)
...  Kellie Cook is a 23 y.o. at [redacted]w[redacted]d here in MAU reporting: CTX's since yesterday that are every 8 minutes. She reports she has also been nauseated this morning and has had decreased fetal movement. She reports bloody show. Denies LOF.   GBS collected yesterday. Has not resulted. Reports she was 4cm at her appointment yesterday.   Reports she paid her deposit for her tubal as well.   Onset of complaint: Yesterday Pain score: 8/10 lower abdomen  FHT: 140 initial external Lab orders placed from triage: none

## 2023-02-21 NOTE — Telephone Encounter (Signed)
 Spanish/Patient would like to go over test results from her last appt.

## 2023-02-21 NOTE — Progress Notes (Signed)
 Reiterated to patient the importance of clicker for fetal movement via Spanish Interpreter

## 2023-02-22 ENCOUNTER — Telehealth: Payer: Self-pay | Admitting: Family Medicine

## 2023-02-22 LAB — HEPATIC FUNCTION PANEL
ALT: 17 [IU]/L (ref 0–32)
AST: 17 [IU]/L (ref 0–40)
Albumin: 3.5 g/dL — ABNORMAL LOW (ref 4.0–5.0)
Alkaline Phosphatase: 273 [IU]/L — ABNORMAL HIGH (ref 44–121)
Bilirubin Total: <0.2 mg/dL (ref 0.0–1.2)
Bilirubin, Direct: <0.08 mg/dL (ref 0.00–0.40)
Total Protein: 6.3 g/dL (ref 6.0–8.5)

## 2023-02-22 LAB — BILE ACIDS, TOTAL: Bile Acids Total: 10 umol/L (ref 0.0–10.0)

## 2023-02-22 NOTE — Telephone Encounter (Signed)
 Patient called in this morning requesting a appointment with Dr. Daisey Dryer regarding the itchiness she has. As per patient she can not take this any longer, She needs to deliver the baby. Informed her I will speak with Dr. Daisey Dryer this afternoon. Spoke with Dr. Daisey Dryer and she advise me tat patient can take benadryl  and her test results are still not back. As per Dr. She will see patient next week.  Patient was also advise about the $494.00 bill she might receive for her postpartum tubal.

## 2023-02-23 ENCOUNTER — Telehealth: Payer: Self-pay | Admitting: Family Medicine

## 2023-02-23 ENCOUNTER — Other Ambulatory Visit: Payer: Self-pay | Admitting: Family Medicine

## 2023-02-23 ENCOUNTER — Inpatient Hospital Stay (HOSPITAL_COMMUNITY)
Admission: AD | Admit: 2023-02-23 | Discharge: 2023-02-25 | DRG: 807 | Disposition: A | Discharge status: Home / Self Care | Payer: Medicaid Other | Attending: Obstetrics and Gynecology | Admitting: Obstetrics and Gynecology

## 2023-02-23 ENCOUNTER — Encounter (HOSPITAL_COMMUNITY): Payer: Self-pay | Admitting: Obstetrics and Gynecology

## 2023-02-23 DIAGNOSIS — Z87891 Personal history of nicotine dependence: Secondary | ICD-10-CM

## 2023-02-23 DIAGNOSIS — O09899 Supervision of other high risk pregnancies, unspecified trimester: Secondary | ICD-10-CM

## 2023-02-23 DIAGNOSIS — O26643 Intrahepatic cholestasis of pregnancy, third trimester: Secondary | ICD-10-CM

## 2023-02-23 DIAGNOSIS — K7689 Other specified diseases of liver: Secondary | ICD-10-CM | POA: Diagnosis present

## 2023-02-23 DIAGNOSIS — Z23 Encounter for immunization: Secondary | ICD-10-CM

## 2023-02-23 DIAGNOSIS — E7879 Other disorders of bile acid and cholesterol metabolism: Secondary | ICD-10-CM | POA: Diagnosis present

## 2023-02-23 DIAGNOSIS — O26649 Intrahepatic cholestasis of pregnancy, unspecified trimester: Secondary | ICD-10-CM | POA: Diagnosis present

## 2023-02-23 DIAGNOSIS — Z2839 Other underimmunization status: Secondary | ICD-10-CM

## 2023-02-23 DIAGNOSIS — Z3A38 38 weeks gestation of pregnancy: Secondary | ICD-10-CM | POA: Diagnosis not present

## 2023-02-23 DIAGNOSIS — Z9189 Other specified personal risk factors, not elsewhere classified: Principal | ICD-10-CM

## 2023-02-23 DIAGNOSIS — O2662 Liver and biliary tract disorders in childbirth: Secondary | ICD-10-CM | POA: Diagnosis not present

## 2023-02-23 HISTORY — DX: Gestational (pregnancy-induced) hypertension without significant proteinuria, unspecified trimester: O13.9

## 2023-02-23 LAB — CBC
HCT: 41.3 % (ref 36.0–46.0)
Hemoglobin: 13.4 g/dL (ref 12.0–15.0)
MCH: 27.7 pg (ref 26.0–34.0)
MCHC: 32.4 g/dL (ref 30.0–36.0)
MCV: 85.3 fL (ref 80.0–100.0)
Platelets: 310 10*3/uL (ref 150–400)
RBC: 4.84 MIL/uL (ref 3.87–5.11)
RDW: 15.1 % (ref 11.5–15.5)
WBC: 12.6 10*3/uL — ABNORMAL HIGH (ref 4.0–10.5)
nRBC: 0 % (ref 0.0–0.2)

## 2023-02-23 LAB — TYPE AND SCREEN
ABO/RH(D): O POS
Antibody Screen: NEGATIVE

## 2023-02-23 LAB — CULTURE, BETA STREP (GROUP B ONLY): Strep Gp B Culture: NEGATIVE

## 2023-02-23 MED ORDER — DIPHENHYDRAMINE HCL 50 MG/ML IJ SOLN: 12.5000 mg | INTRAMUSCULAR | Status: DC | PRN

## 2023-02-23 MED ORDER — OXYCODONE-ACETAMINOPHEN 5-325 MG PO TABS: 1.0000 | ORAL_TABLET | ORAL | Status: DC | PRN

## 2023-02-23 MED ORDER — SOD CITRATE-CITRIC ACID 500-334 MG/5ML PO SOLN: 30.0000 mL | ORAL | Status: DC | PRN

## 2023-02-23 MED ORDER — EPHEDRINE 5 MG/ML INJ: 10.0000 mg | INTRAVENOUS | Status: DC | PRN

## 2023-02-23 MED ORDER — PHENYLEPHRINE 80 MCG/ML (10ML) SYRINGE FOR IV PUSH (FOR BLOOD PRESSURE SUPPORT): 80.0000 ug | PREFILLED_SYRINGE | INTRAVENOUS | Status: DC | PRN

## 2023-02-23 MED ORDER — LACTATED RINGERS IV SOLN: 500.0000 mL | INTRAVENOUS | Status: DC | PRN

## 2023-02-23 MED ORDER — OXYTOCIN-SODIUM CHLORIDE 30-0.9 UT/500ML-% IV SOLN
2.5000 [IU]/h | INTRAVENOUS | Status: DC
  Administered 2023-02-24: 2.5 [IU]/h via INTRAVENOUS

## 2023-02-23 MED ORDER — FENTANYL-BUPIVACAINE-NACL 0.5-0.125-0.9 MG/250ML-% EP SOLN
12.0000 mL/h | EPIDURAL | Status: DC | PRN
  Filled 2023-02-23: qty 250

## 2023-02-23 MED ORDER — LACTATED RINGERS IV SOLN
500.0000 mL | Freq: Once | INTRAVENOUS | Status: AC
  Administered 2023-02-24: 500 mL via INTRAVENOUS

## 2023-02-23 MED ORDER — OXYTOCIN-SODIUM CHLORIDE 30-0.9 UT/500ML-% IV SOLN
1.0000 m[IU]/min | INTRAVENOUS | Status: DC
  Administered 2023-02-24: 2 m[IU]/min via INTRAVENOUS
  Filled 2023-02-23: qty 500

## 2023-02-23 MED ORDER — FAMOTIDINE 20 MG PO TABS
20.0000 mg | ORAL_TABLET | Freq: Once | ORAL | Status: AC
  Administered 2023-02-24: 20 mg via ORAL
  Filled 2023-02-23: qty 1

## 2023-02-23 MED ORDER — LACTATED RINGERS IV SOLN: INTRAVENOUS | Status: DC

## 2023-02-23 MED ORDER — TERBUTALINE SULFATE 1 MG/ML IJ SOLN: 0.2500 mg | Freq: Once | INTRAMUSCULAR | Status: DC | PRN

## 2023-02-23 MED ORDER — ONDANSETRON HCL 4 MG/2ML IJ SOLN
4.0000 mg | Freq: Four times a day (QID) | INTRAMUSCULAR | Status: DC | PRN
  Administered 2023-02-24: 4 mg via INTRAVENOUS
  Filled 2023-02-23: qty 2

## 2023-02-23 MED ORDER — LIDOCAINE HCL (PF) 1 % IJ SOLN: 30.0000 mL | INTRAMUSCULAR | Status: DC | PRN

## 2023-02-23 MED ORDER — OXYTOCIN BOLUS FROM INFUSION
333.0000 mL | Freq: Once | INTRAVENOUS | Status: AC
  Administered 2023-02-24: 333 mL via INTRAVENOUS

## 2023-02-23 MED ORDER — OXYCODONE-ACETAMINOPHEN 5-325 MG PO TABS: 2.0000 | ORAL_TABLET | ORAL | Status: DC | PRN

## 2023-02-23 MED ORDER — ACETAMINOPHEN 325 MG PO TABS
650.0000 mg | ORAL_TABLET | ORAL | Status: DC | PRN
  Administered 2023-02-24: 650 mg via ORAL
  Filled 2023-02-23: qty 2

## 2023-02-23 NOTE — Telephone Encounter (Addendum)
Called pt with Spanish Interpreter Eda., and informed pt that the results that have resulted are normal.  Pt states that she is having itching all over and bile acids are a 10.  I encouraged pt to use Benadryl and hydrocortisone cream to see if she gets relief will call back.    Leonette Nutting

## 2023-02-23 NOTE — MAU Note (Addendum)
.  Kellie Cook is a 23 y.o. at [redacted]w[redacted]d here in MAU reporting coming in for IOL due to elevated liver enzymes. Denies LOF or VB. Increased clear, musousy d/c. Reports slightly decreased FM today. Feels a lot of burning in upper abdomen but more burning than heartburn. Symptoms for 4 days. Mouth and face very dry and face was red. Slightly pink now. States was here few days ago and nothing was done regarding this. Tried Tums for heartburn like feeling but does not help.    Onset of complaint: days Pain score: 9 Vitals:   02/23/23 1947 02/23/23 1950  BP:  125/71  Pulse: 86   Resp: 17   Temp: 98 F (36.7 C)   SpO2: 100%      FHT:134 Lab orders placed from triage: none
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
## 2023-02-23 NOTE — Telephone Encounter (Signed)
Spoke with patient about coming to LD for direct admit for cholestasis of pregnancy.  Bile Acids - 10  She reports continued itching and RUQ pain.   Reviewed coming to [facility] for induction. She was amenable. Plans to arrive about 6PM due to arranging childcare.

## 2023-02-24 ENCOUNTER — Encounter (HOSPITAL_COMMUNITY): Payer: Self-pay | Admitting: Family Medicine

## 2023-02-24 ENCOUNTER — Telehealth: Payer: Self-pay

## 2023-02-24 ENCOUNTER — Encounter: Payer: Self-pay | Admitting: Obstetrics and Gynecology

## 2023-02-24 ENCOUNTER — Inpatient Hospital Stay (HOSPITAL_COMMUNITY): Payer: Medicaid Other | Admitting: Anesthesiology

## 2023-02-24 ENCOUNTER — Other Ambulatory Visit: Payer: Self-pay

## 2023-02-24 DIAGNOSIS — O2662 Liver and biliary tract disorders in childbirth: Secondary | ICD-10-CM

## 2023-02-24 DIAGNOSIS — Z3A38 38 weeks gestation of pregnancy: Secondary | ICD-10-CM

## 2023-02-24 LAB — RPR: RPR Ser Ql: NONREACTIVE

## 2023-02-24 MED ORDER — SIMETHICONE 80 MG PO CHEW: 80.0000 mg | CHEWABLE_TABLET | ORAL | Status: DC | PRN

## 2023-02-24 MED ORDER — PRENATAL MULTIVITAMIN CH
1.0000 | ORAL_TABLET | Freq: Every day | ORAL | Status: DC
  Administered 2023-02-25: 1 via ORAL
  Filled 2023-02-24: qty 1

## 2023-02-24 MED ORDER — FENTANYL-BUPIVACAINE-NACL 0.5-0.125-0.9 MG/250ML-% EP SOLN
EPIDURAL | Status: DC | PRN
  Administered 2023-02-24: 12 mL/h via EPIDURAL

## 2023-02-24 MED ORDER — ACETAMINOPHEN 325 MG PO TABS: 650.0000 mg | ORAL_TABLET | ORAL | Status: DC | PRN

## 2023-02-24 MED ORDER — ACETAMINOPHEN 500 MG PO TABS: 1000.0000 mg | ORAL_TABLET | Freq: Once | ORAL | Status: DC

## 2023-02-24 MED ORDER — WITCH HAZEL-GLYCERIN EX PADS: 1.0000 | MEDICATED_PAD | CUTANEOUS | Status: DC | PRN

## 2023-02-24 MED ORDER — OXYCODONE HCL 5 MG PO TABS: 5.0000 mg | ORAL_TABLET | ORAL | Status: DC | PRN

## 2023-02-24 MED ORDER — IBUPROFEN 600 MG PO TABS
600.0000 mg | ORAL_TABLET | Freq: Four times a day (QID) | ORAL | Status: DC
  Administered 2023-02-24 – 2023-02-25 (×3): 600 mg via ORAL
  Filled 2023-02-24 (×3): qty 1

## 2023-02-24 MED ORDER — OXYTOCIN-SODIUM CHLORIDE 30-0.9 UT/500ML-% IV SOLN
1.0000 m[IU]/min | INTRAVENOUS | Status: DC
Start: 2023-02-24 — End: 2023-02-24

## 2023-02-24 MED ORDER — SENNOSIDES-DOCUSATE SODIUM 8.6-50 MG PO TABS
2.0000 | ORAL_TABLET | Freq: Every day | ORAL | Status: DC
  Administered 2023-02-25: 2 via ORAL
  Filled 2023-02-24: qty 2

## 2023-02-24 MED ORDER — LIDOCAINE HCL (PF) 1 % IJ SOLN
INTRAMUSCULAR | Status: DC | PRN
  Administered 2023-02-24: 5 mL via EPIDURAL

## 2023-02-24 MED ORDER — MEASLES, MUMPS & RUBELLA VAC IJ SOLR
0.5000 mL | Freq: Once | INTRAMUSCULAR | Status: AC
  Administered 2023-02-25: 0.5 mL via SUBCUTANEOUS
  Filled 2023-02-24: qty 0.5

## 2023-02-24 MED ORDER — TETANUS-DIPHTH-ACELL PERTUSSIS 5-2.5-18.5 LF-MCG/0.5 IM SUSY: 0.5000 mL | PREFILLED_SYRINGE | Freq: Once | INTRAMUSCULAR | Status: DC

## 2023-02-24 MED ORDER — ONDANSETRON HCL 4 MG/2ML IJ SOLN: 4.0000 mg | INTRAMUSCULAR | Status: DC | PRN

## 2023-02-24 MED ORDER — ACETAMINOPHEN-CAFFEINE 500-65 MG PO TABS
2.0000 | ORAL_TABLET | Freq: Once | ORAL | Status: AC
  Administered 2023-02-24: 2 via ORAL
  Filled 2023-02-24: qty 2

## 2023-02-24 MED ORDER — BENZOCAINE-MENTHOL 20-0.5 % EX AERO
1.0000 | INHALATION_SPRAY | CUTANEOUS | Status: DC | PRN
  Administered 2023-02-25: 1 via TOPICAL
  Filled 2023-02-24: qty 56

## 2023-02-24 MED ORDER — ONDANSETRON HCL 4 MG PO TABS: 4.0000 mg | ORAL_TABLET | ORAL | Status: DC | PRN

## 2023-02-24 MED ORDER — OXYCODONE HCL 5 MG PO TABS: 10.0000 mg | ORAL_TABLET | ORAL | Status: DC | PRN

## 2023-02-24 MED ORDER — FENTANYL CITRATE (PF) 100 MCG/2ML IJ SOLN: 100.0000 ug | INTRAMUSCULAR | Status: DC | PRN

## 2023-02-24 MED ORDER — TERBUTALINE SULFATE 1 MG/ML IJ SOLN: 0.2500 mg | Freq: Once | INTRAMUSCULAR | Status: DC | PRN

## 2023-02-24 MED ORDER — DIBUCAINE (PERIANAL) 1 % EX OINT: 1.0000 | TOPICAL_OINTMENT | CUTANEOUS | Status: DC | PRN

## 2023-02-24 MED ORDER — ZOLPIDEM TARTRATE 5 MG PO TABS: 5.0000 mg | ORAL_TABLET | Freq: Every evening | ORAL | Status: DC | PRN

## 2023-02-24 MED ORDER — DIPHENHYDRAMINE HCL 25 MG PO CAPS
25.0000 mg | ORAL_CAPSULE | Freq: Four times a day (QID) | ORAL | Status: DC | PRN
Start: 2023-02-24 — End: 2023-02-25

## 2023-02-24 MED ORDER — COCONUT OIL OIL: 1.0000 | TOPICAL_OIL | Status: DC | PRN

## 2023-02-24 NOTE — Discharge Summary (Signed)
Postpartum Discharge Summary  Date of Service updated***     Patient Name: Kellie Cook DOB: Aug 23, 2000 MRN: [id number]  Date of admission: 02/23/2023 Delivery date:02/24/2023 Delivering provider: Joanne Gavel Date of discharge: 02/24/2023  Admitting diagnosis: Cholestasis during pregnancy [O26.649] Intrauterine pregnancy: [redacted]w[redacted]d     Secondary diagnosis:  Principal Problem:   Cholestasis during pregnancy  Additional problems: ***    Discharge diagnosis: VBAC                                              Post partum procedures:{Postpartum procedures:23558} Augmentation: AROM, Pitocin, and IP Foley Complications: None  Hospital course: Induction of Labor With Vaginal Delivery   23 y.o. yo [id number] at [redacted]w[redacted]d was admitted to the hospital 02/23/2023 for induction of labor.  Indication for induction: Cholestasis of pregnancy.  Patient had an labor course complicated by none.  Membrane Rupture Time/Date: 8:59 AM,02/24/2023  Delivery Method:Vaginal, Spontaneous Operative Delivery:N/A Episiotomy: None Lacerations:  None Details of delivery can be found in separate delivery note.  Patient had a postpartum course complicated by***. Patient is discharged home 02/24/23.  Newborn Data: Birth date:02/24/2023 Birth time:1:54 PM Gender:Female Living status:Living Apgars: ,  Weight:3560 g  Magnesium Sulfate received: No BMZ received: No Rhophylac:No MMR:{MMR:30440033} T-DaP:Given prenatally Flu: Given prenatally RSV Vaccine received: {RSV:31013} Transfusion:{Transfusion received:30440034} Immunizations administered: Immunization History  Administered Date(s) Administered   Influenza, Seasonal, Injecte, Preservative Fre 12/21/2022   Influenza,inj,Quad PF,6+ Mos 10/20/2015   Tdap 02/10/2016, 12/21/2022    Physical exam  Vitals:   02/24/23 1131 02/24/23 1201 02/24/23 1231 02/24/23 1400  BP: 119/65 105/68 93/66 122/83  Pulse: 75 88 100 (!) 110  Resp:      Temp:       TempSrc:      SpO2:      Weight:      Height:       General: {Exam; general:21111117} Lochia: {Desc; appropriate/inappropriate:30686::"appropriate"} Uterine Fundus: {Desc; firm/soft:30687} Incision: {Exam; incision:21111123} DVT Evaluation: {Exam; dvt:2111122} Labs: Lab Results  Component Value Date   WBC 12.6 (H) 02/23/2023   HGB 13.4 02/23/2023   HCT 41.3 02/23/2023   MCV 85.3 02/23/2023   PLT 310 02/23/2023      Latest Ref Rng & Units 02/20/2023    4:19 PM  CMP  Total Protein 6.0 - 8.5 g/dL 6.3   Total Bilirubin 0.0 - 1.2 mg/dL <8.4   Alkaline Phos 44 - 121 IU/L 273   AST 0 - 40 IU/L 17   ALT 0 - 32 IU/L 17    Edinburgh Score:     No data to display            After visit meds:  Allergies as of 02/24/2023   No Known Allergies   Med Rec must be completed prior to using this [facility]***        Discharge home in stable condition Infant Feeding: Bottle and Breast Infant Disposition:{CHL IP OB HOME WITH ONGEXB:28413} Discharge instruction: per After Visit Summary and Postpartum booklet. Activity: Advance as tolerated. Pelvic rest for 6 weeks.  Diet: {OB diet:21111121} Anticipated Birth Control: {Birth Control:23956} Postpartum Appointment:6 weeks Additional Postpartum F/U: {PP Procedure:23957} Future Appointments: Future Appointments  Date Time Provider Department Center  03/01/2023  9:00 AM CENTERING PROVIDER [facility]  03/15/2023  9:00 AM CENTERING PROVIDER [facility]   Follow up Visit:  Message sent to [facility]  1/17    02/24/2023 Margie Billet, MD

## 2023-02-24 NOTE — Progress Notes (Addendum)
Kellie Cook is a 23 y.o. [id number] at [redacted]w[redacted]d by early [place] admitted for induction of labor due to cholestasis.  Subjective: Patient is sitting comfortably in the bed and notes that her contractions are very mild at this time. She does report a headache. She is having good fetal movement. She notes a small amount of fluid leakage about 1 hour ago. She plans on breast and bottle feeding. She is interested in discussing a tubal ligation for birth control and is adamant that she does not want any other children. A spanish interpreter was present for all conversation.    Objective: BP 127/71 (BP Location: Right Arm)   Pulse 90   Temp 97.9 F (36.6 C) (Axillary)   Resp 17   Ht 4\' 11"  (1.499 m)   Wt 114.8 kg   SpO2 100%   BMI 51.10 kg/m  No intake/output data recorded. No intake/output data recorded.  FHT:  FHR: 135 bpm, variability: moderate,  accelerations:  Present,  decelerations:  Absent UC:   irregular, every 5 minutes  Dilated to 3 cm. 60% effaced. -3 position.   Labs: Lab Results  Component Value Date   WBC 12.6 (H) 02/23/2023   HGB 13.4 02/23/2023   HCT 41.3 02/23/2023   MCV 85.3 02/23/2023   PLT 310 02/23/2023    Assessment / Plan: Induction of labor due to cholestasis,  progressing well. Started on pitocin around 0025 Labor: Progressing on Pitocin, will continue to increase then AROM Preeclampsia:  no signs or symptoms of toxicity Fetal Wellbeing:  Category I Pain Control:   Patient states that pain is manageable at this time and would like to use an epidural as needed. Will discuss further pain management at next cervical check.  I/D:  n/a Anticipated MOD:  NSVD  Hilton Sinclair, Student-PA 02/24/2023, 12:20 AM  GME ATTESTATION:  Evaluation and management procedures were performed by the [facility] Resident under my supervision. I was immediately available for direct supervision, assistance and direction throughout this encounter.  I also confirm that I  have verified the information documented in the resident's note, and that I have also personally reperformed the pertinent components of the physical exam and all of the medical decision making activities.  I have also made any necessary editorial changes.  Wyn Forster, MD OB Fellow, Faculty Practice [facility], Center for [facility] 02/24/2023 1:10 AM

## 2023-02-24 NOTE — Anesthesia Procedure Notes (Signed)
Epidural Patient location during procedure: OB Start time: 02/24/2023 9:47 AM End time: 02/24/2023 10:04 AM  Staffing Anesthesiologist: Trevor Iha, MD Performed: anesthesiologist   Preanesthetic Checklist Completed: patient identified, IV checked, site marked, risks and benefits discussed, surgical consent, monitors and equipment checked, pre-op evaluation and timeout performed  Epidural Patient position: sitting Prep: DuraPrep and site prepped and draped Patient monitoring: continuous pulse ox and blood pressure Approach: midline Location: L3-L4 Injection technique: LOR air  Needle:  Needle type: Tuohy  Needle gauge: 17 G Needle length: 9 cm and 9 Needle insertion depth: 9 cm Catheter type: closed end flexible Catheter size: 19 Gauge Catheter at skin depth: 15 cm Test dose: negative  Assessment Events: blood not aspirated, no cerebrospinal fluid, injection not painful, no injection resistance, no paresthesia and negative IV test  Additional Notes Patient identified. Risks/Benefits/Options discussed with patient including but not limited to bleeding, infection, nerve damage, paralysis, failed block, incomplete pain control, headache, blood pressure changes, nausea, vomiting, reactions to medication both or allergic, itching and postpartum back pain. Confirmed with bedside nurse the patient's most recent platelet count. Confirmed with patient that they are not currently taking any anticoagulation, have any bleeding history or any family history of bleeding disorders. Patient expressed understanding and wished to proceed. All questions were answered. Sterile technique was used throughout the entire procedure. Please see nursing notes for vital signs. Test dose was given through epidural needle and negative prior to continuing to dose epidural or start infusion. Warning signs of high block given to the patient including shortness of breath, tingling/numbness in hands, complete  motor block, or any concerning symptoms with instructions to call for help. Patient was given instructions on fall risk and not to get out of bed. All questions and concerns addressed with instructions to call with any issues. 1 Attempt (S) . Patient tolerated procedure well.

## 2023-02-24 NOTE — Progress Notes (Signed)
LABOR PROGRESS NOTE  Patient Name: Kellie Cook, female   DOB: December 18, 2000, 23 y.o.  MRN: [id number]  [id number] at [redacted]w[redacted]d admitted for IOL for cholestasis  Patient feeling stronger contractions, 7/10. Amenable to AROM. Risks/benefits of AROM discussed with patient, and verbal consent obtained. Obtained scant clear fluid. Mom and babe tolerated well. Cat I tracing with contractions Q2-3.  Joanne Gavel, MD FMOB Fellow, Faculty practice [facility], Center for [facility] 02/24/23  9:09 AM

## 2023-02-24 NOTE — Anesthesia Preprocedure Evaluation (Addendum)
Anesthesia Evaluation  Patient identified by MRN, date of birth, ID band Patient awake    Reviewed: Allergy & Precautions, NPO status , Patient's Chart, lab work & pertinent test results  Airway Mallampati: III  TM Distance: >3 FB Neck ROM: Full    Dental no notable dental hx. (+) Teeth Intact   Pulmonary former smoker   Pulmonary exam normal breath sounds clear to auscultation       Cardiovascular hypertension, Normal cardiovascular exam Rhythm:Regular Rate:Normal     Neuro/Psych negative neurological ROS  negative psych ROS   GI/Hepatic Neg liver ROS,,,  Endo/Other  negative endocrine ROS    Renal/GU negative Renal ROS     Musculoskeletal   Abdominal  (+) + obese (BMI 51.1)  Peds  Hematology   Anesthesia Other Findings   Reproductive/Obstetrics (+) Pregnancy                             Anesthesia Physical Anesthesia Plan  ASA: 3  Anesthesia Plan: Epidural   Post-op Pain Management:    Induction:   PONV Risk Score and Plan:   Airway Management Planned:   Additional Equipment:   Intra-op Plan:   Post-operative Plan:   Informed Consent: I have reviewed the patients History and Physical, chart, labs and discussed the procedure including the risks, benefits and alternatives for the proposed anesthesia with the patient or authorized representative who has indicated his/her understanding and acceptance.     Interpreter used for interview  Plan Discussed with:   Anesthesia Plan Comments: (38.6 G3p2 BMI 51.1 spanish speaking for epidural)        Anesthesia Quick Evaluation

## 2023-02-24 NOTE — Progress Notes (Signed)
Labor Progress Note Rheannon Spiers is a 23 y.o. G3P2002 at 107w6d presented for IOL of for cholestasis S: comfortable, agreeable to AROM  O:  BP (!) 96/48   Pulse 70   Temp (!) 97.4 F (36.3 C) (Oral)   Resp 17   Ht 4\' 11"  (1.499 m)   Wt 114.8 kg   SpO2 100%   BMI 51.10 kg/m   EFM: baseline 150, accels, no decels, moderate variability TOCO: q3.5-48min contractions  CVE: Dilation: 4 Effacement (%): 50 Station: -3 Presentation: Vertex Exam by:: Iyannah Blake MD   A&P: 23 y.o. [id number] [redacted]w[redacted]d  #Labor: No change on 8u pitocin. Very ballotable. Will continue to titrate pitocin #Pain: none #FWB: Cat I #GBS negative   Wyn Forster, MD 4:40 AM

## 2023-02-24 NOTE — Progress Notes (Signed)
Event Note  Evaluated patient shortly after delivery for possible postpartum tubal ligation. Difficult to palpate fundus due to habitus (BMI 51). Reviewed that she was not a good candidate for an immediate postpartum tubal ligation due anticipated challenges with visualization that could result in higher risk of infection, injury to intraabdominal organs, hemorrhage, or need for additional procedures/rehospitalization. Patient was very upset/angry stating that she had paid for surgery and strongly desired permanent sterilization in the immediate postpartum period. She requested information about interval sterilization procedures and if the money she already paid could be used for interval tubal.   I was able to discuss her case with both our Engineer, manufacturing  and pass on anticipated cost of interval tubal (~$5600) without financial assistance. It takes 90 days to process a refund. I relayed this information to the patient who confirms she is applying for medicaid. Discussed that medicaid coverage would allow [place] to do interval tubal at lower OOP cost. I apologized that she paid and that we could not do her procedure. I apologized for any confusion/miscommunication that she would definitely be getting a postpartum tubal ligation. Patient reports that she feels she was lied to and voices justifiable frustration that she won't be able to get the sterilization procedure she desires. I again apologized but reviewed that I did not want to endanger her with an inappropriate surgery.   I was later able to review her case with the provider who saw her for most of prenatal visits who was able to confirm that the patient had been repeatedly counseled that immediate postpartum tubal  ligation would not be a safe option for her and they discussed tubal only if cesarean section.   Ultimately, patient expressed her understanding and is accepting that tubal cannot be performed this admission. She will complete her medicaid  application and follow up for interval tubal as feasible. Encouraged patient to consider other contraceptive options to bridge her to tubal.   Harvie Bridge, MD Obstetrician & Gynecologist, [facility] for [facility], [facility]

## 2023-02-24 NOTE — Telephone Encounter (Signed)
Per Dr. Alvester Morin, Bile acids are elevated at 10.  Meets criteria for cholestasis of pregnancy.   Called pt in regards to her bile acids.  Pt states that she is in hospital in labor.  Pt informed that if she has questions to please call the office and congrats.    Leonette Nutting

## 2023-02-25 MED ORDER — IBUPROFEN 600 MG PO TABS: 600.0000 mg | ORAL_TABLET | Freq: Four times a day (QID) | ORAL | 0 refills | Status: AC

## 2023-02-25 MED ORDER — SENNOSIDES-DOCUSATE SODIUM 8.6-50 MG PO TABS: 2.0000 | ORAL_TABLET | Freq: Every day | ORAL | 0 refills | Status: AC

## 2023-02-25 NOTE — Anesthesia Postprocedure Evaluation (Signed)
Anesthesia Post Note  Patient: Kellie Cook  Procedure(s) Performed: AN AD HOC LABOR EPIDURAL     Patient location during evaluation: Mother Baby Anesthesia Type: Epidural Level of consciousness: awake and alert Pain management: pain level controlled Vital Signs Assessment: post-procedure vital signs reviewed and stable Respiratory status: spontaneous breathing, nonlabored ventilation and respiratory function stable Cardiovascular status: stable Postop Assessment: no headache, no backache, epidural receding and able to ambulate Anesthetic complications: no   No notable events documented.  Last Vitals:  Vitals:   02/24/23 2030 02/25/23 0524  BP:  115/62  Pulse: 69 63  Resp: 18 18  Temp: 36.7 C 36.8 C  SpO2: 98% 99%    Last Pain:  Vitals:   02/25/23 0525  TempSrc:   PainSc: 0-No pain   Pain Goal: Patients Stated Pain Goal: 0 (02/23/23 1958)                 Dawnmarie Breon
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
## 2023-02-27 ENCOUNTER — Telehealth: Payer: Self-pay | Admitting: Family Medicine

## 2023-02-27 NOTE — Telephone Encounter (Signed)
Patient called stating that she has already had the baby but did not receive her tubal while in the hospital and she was upset about the whole situation because they had told her at the hospital that there was no documentation that she had paid for her tubal and that she needed to call us [facility] for [employer]). She also stated that she would still like the tubal but could not pay more then what she was quoted at first for which was $1,170.

## 2023-03-07 ENCOUNTER — Telehealth (HOSPITAL_COMMUNITY): Payer: Self-pay | Admitting: *Deleted

## 2023-03-07 NOTE — Telephone Encounter (Signed)
03/07/2023  Name: Kellie Cook MRN: [id number] DOB: 11-18-2000  Reason for Call:  Transition of Care Hospital Discharge Call  Contact Status: Patient Contact Status: Complete  Language assistant needed: Interpreter Mode: Telephonic Interpreter Interpreter Name: Bettye Boeck # [id number]        Follow-Up Questions: Do You Have Any Concerns About Your Health As You Heal From Delivery?: No Do You Have Any Concerns About Your Infants Health?: No  Edinburgh Postnatal Depression Scale:  In the Past 7 Days:    PHQ2-9 Depression Scale:     Discharge Follow-up: Edinburgh score requires follow up?: N/A (patient declines answering questions; states she is doing well emotionally)  Post-discharge interventions: Reviewed Newborn Safe Sleep Practices  Malachy Mood 03/07/2023 1912

## 2023-04-06 ENCOUNTER — Telehealth: Payer: Self-pay | Admitting: *Deleted

## 2023-04-06 NOTE — Telephone Encounter (Addendum)
 Voicemail message received from [facility] RN.  She stated that she performed home visit for [place] and has several concerns. Pt c/o persistent back pain @ location of epidural. The pain scale is 8/10 and 10/10. Pt also reported having blurry vision and is not eating. BP was  90/72. Edinburgh scale = 13. Pt has PP appt scheduled on 3/4 however wants to be seen sooner. I called pt w/interpreter Eda Royal and discussed the concerns presented by [facility] RN.  Pt stated that the nurse visit in her home occurred on 2/21. Pt stated that her back pain began 2 weeks ago and has been getting worse. She also reports having H/A x4 days as well as blurry vision. Pt reports she checked her BP yesterday and today with result both days of 140/85. I advised that pt's symptoms are concerning and she requires further evaluation @ [facility] today.  Pt stated that she does not have child care for her baby and cannot go to hospital today.  I re-iterated the extreme importance of seeking medical care today @ [facility] and that her sx could be related to a serious problem. Pt voiced understanding and agreed to go to hospital today.

## 2023-04-11 ENCOUNTER — Ambulatory Visit (INDEPENDENT_AMBULATORY_CARE_PROVIDER_SITE_OTHER): Payer: Self-pay | Admitting: Obstetrics and Gynecology

## 2023-04-11 ENCOUNTER — Other Ambulatory Visit: Payer: Self-pay

## 2023-04-11 DIAGNOSIS — Z3009 Encounter for other general counseling and advice on contraception: Secondary | ICD-10-CM

## 2023-04-11 NOTE — Progress Notes (Signed)
 Post Partum Visit Note  Kellie Cook is a 23 y.o. [phone] female who presents for a postpartum visit. She is 6 weeks postpartum following a normal spontaneous vaginal delivery.  I have fully reviewed the prenatal and intrapartum course. The delivery was at [redacted]w[redacted]d gestational weeks.  Anesthesia: epidural. Postpartum course has been uncomplicated. Baby is doing well. Baby is feeding by bottle - Similac . Bleeding no bleeding. Bowel function is normal. Bladder function is normal. Patient is sexually active. Contraception method is  condoms . Postpartum depression screening: negative.   Upstream - 04/11/23 1110       Pregnancy Intention Screening   Does the patient want to become pregnant in the next year? No    Does the patient's partner want to become pregnant in the next year? No    Would the patient like to discuss contraceptive options today? No      Contraception Wrap Up   Current Method No Contraceptive Precautions    End Method Female Sterilization    Contraception Counseling Provided No    How was the end contraceptive method provided? N/A            The pregnancy intention screening data noted above was reviewed. Potential methods of contraception were discussed. The patient elected to proceed with Female Sterilization.   Edinburgh Postnatal Depression Scale - 04/11/23 1107       Edinburgh Postnatal Depression Scale:  In the Past 7 Days   I have been able to laugh and see the funny side of things. 0    I have looked forward with enjoyment to things. 0    I have blamed myself unnecessarily when things went wrong. 2    I have been anxious or worried for no good reason. 1    I have felt scared or panicky for no good reason. 0    Things have been getting on top of me. 1    I have been so unhappy that I have had difficulty sleeping. 0    I have felt sad or miserable. 1    I have been so unhappy that I have been crying. 0    The thought of harming myself has  occurred to me. 0    Edinburgh Postnatal Depression Scale Total 5             Health Maintenance Due  Topic Date Due   HPV VACCINES (1 - 3-dose series) Never done   COVID-19 Vaccine (1 - 2024-25 season) Never done    The following portions of the patient's history were reviewed and updated as appropriate: allergies, current medications, past family history, past medical history, past social history, past surgical history, and problem list.  Review of Systems Pertinent items are noted in HPI.  Objective:  BP 119/78   Pulse 94   Wt 229 lb 11.2 oz (104.2 kg)   LMP 04/09/2023   Breastfeeding No   BMI 46.39 kg/m    General:  alert, cooperative, and no distress  Lungs: Normal effort  Heart:  Regular rate  GU exam:  not indicated       Assessment:   6 wk postpartum exam.   Plan:   Essential components of care per ACOG recommendations:  1.  Mood and well being: Patient with negative depression screening today. Reviewed local resources for support.  - Patient tobacco use? No.   - hx of drug use? No.    2. Infant care and feeding:  -  Patient currently breastmilk feeding? No.  -Social determinants of health (SDOH) reviewed in EPIC. No concerns  3. Sexuality, contraception and birth spacing - Patient does not want a pregnancy in the next year.  Desired family size is 3 children.  - Reviewed reproductive life planning. Reviewed contraceptive methods based on pt preferences and effectiveness.  Patient desired salpingectomy today. She prepaid for PPTL but then was told not a candidate (see. Dr. Aneta Mins note). Will send message to [place].   - Discussed birth spacing of 18 months  4. Sleep and fatigue -Encouraged family/partner/community support of 4 hrs of uninterrupted sleep to help with mood and fatigue  5. Physical Recovery  - Discussed patients delivery and complications. She describes her labor as good. - Patient had a  SVD complicated by shoulder  dystocia . Patient had a 1st degree laceration. Perineal healing reviewed. Patient expressed understanding - Patient has urinary incontinence? No. - Patient is safe to resume physical and sexual activity  6.  Health Maintenance - HM due items addressed Yes - Last pap smear  Diagnosis  Date Value Ref Range Status  09/26/2022   Final   - Negative for intraepithelial lesion or malignancy (NILM)   Pap smear not done at today's visit.  -Breast Cancer screening indicated? No.   7. Chronic Disease/Pregnancy Condition follow up: None - PCP follow up  Milas Hock, MD [facility], [facility]

## 2023-05-08 ENCOUNTER — Telehealth: Payer: Self-pay

## 2023-05-08 NOTE — Telephone Encounter (Signed)
 Complete

## 2023-05-09 ENCOUNTER — Telehealth: Payer: Self-pay | Admitting: Family Medicine

## 2023-05-09 NOTE — Telephone Encounter (Signed)
 Called patient to discuss what she is needing to bring in for her financial aid application and she is waiting on 2 letters, one to be notarized. We are waiting for her to bring in those documents and we can send them to patient accounting. I will call patient at the end of the week if I do not hear from her by Thursday

## 2023-06-01 ENCOUNTER — Telehealth: Payer: Self-pay

## 2023-06-02 NOTE — Telephone Encounter (Signed)
 Complete

## 2023-07-12 ENCOUNTER — Telehealth: Payer: Self-pay | Admitting: Family Medicine

## 2023-07-12 NOTE — Telephone Encounter (Signed)
 This morning, the patient arrived at the office requesting a refund for a previous payment. As in prior visits, she was informed that a refund could not be issued due to an outstanding balance with [employer].  The patient was advised that financial assistance may be available if she provides the necessary paperwork. However, she stated that her request for assistance had been denied.  She then requested a receipt for the payment she previously made.  Eda our in person interpreter was used
